# Patient Record
Sex: Female | Born: 1990 | Race: Black or African American | Hispanic: No | Marital: Single | Smoking: Never smoker
Health system: Southern US, Community
[De-identification: ages and names within clinical notes are randomized; demographics above are authoritative.]

## PROBLEM LIST (undated history)

## (undated) DIAGNOSIS — G43909 Migraine, unspecified, not intractable, without status migrainosus: Secondary | ICD-10-CM

## (undated) DIAGNOSIS — R87629 Unspecified abnormal cytological findings in specimens from vagina: Secondary | ICD-10-CM

## (undated) DIAGNOSIS — Z8619 Personal history of other infectious and parasitic diseases: Secondary | ICD-10-CM

## (undated) HISTORY — DX: Migraine, unspecified, not intractable, without status migrainosus: G43.909

## (undated) HISTORY — DX: Unspecified abnormal cytological findings in specimens from vagina: R87.629

## (undated) HISTORY — PX: THERAPEUTIC ABORTION: SHX798

## (undated) HISTORY — DX: Personal history of other infectious and parasitic diseases: Z86.19

## (undated) HISTORY — PX: NO PAST SURGERIES: SHX2092

---
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
## 2011-01-02 LAB — CBC
HCT: 28.8 % — ABNORMAL LOW (ref 36.0–49.0)
HCT: 39.1 % (ref 36.0–49.0)
Hemoglobin: 13.1 g/dL (ref 12.0–16.0)
MCHC: 33.6 g/dL (ref 31.0–37.0)
MCHC: 33.8 g/dL (ref 31.0–37.0)
MCV: 93.6 fL (ref 78.0–98.0)
MCV: 95.4 fL (ref 78.0–98.0)
Platelets: 167 10*3/uL (ref 150–400)
RBC: 4.17 MIL/uL (ref 3.80–5.70)
RDW: 13.1 % (ref 11.4–15.5)
RDW: 13.3 % (ref 11.4–15.5)
WBC: 17.5 10*3/uL — ABNORMAL HIGH (ref 4.5–13.5)

## 2011-01-05 ENCOUNTER — Emergency Department (HOSPITAL_COMMUNITY)
Admission: EM | Admit: 2011-01-05 | Discharge: 2011-01-05 | Disposition: A | Discharge status: Home / Self Care | Payer: Medicaid Other | Attending: Emergency Medicine | Admitting: Emergency Medicine

## 2011-01-05 DIAGNOSIS — R51 Headache: Secondary | ICD-10-CM | POA: Insufficient documentation

## 2011-01-05 DIAGNOSIS — R110 Nausea: Secondary | ICD-10-CM | POA: Insufficient documentation

## 2011-01-05 DIAGNOSIS — R040 Epistaxis: Secondary | ICD-10-CM | POA: Insufficient documentation

## 2011-01-08 LAB — URINALYSIS, ROUTINE W REFLEX MICROSCOPIC
Bilirubin Urine: NEGATIVE
Ketones, ur: NEGATIVE mg/dL
Nitrite: NEGATIVE
Protein, ur: NEGATIVE mg/dL
pH: 7 (ref 5.0–8.0)

## 2011-01-15 ENCOUNTER — Emergency Department (HOSPITAL_COMMUNITY)
Admission: EM | Admit: 2011-01-15 | Discharge: 2011-01-15 | Disposition: A | Discharge status: Home / Self Care | Payer: Medicaid Other | Attending: Emergency Medicine | Admitting: Emergency Medicine

## 2011-01-15 DIAGNOSIS — J069 Acute upper respiratory infection, unspecified: Secondary | ICD-10-CM | POA: Insufficient documentation

## 2011-01-15 DIAGNOSIS — J029 Acute pharyngitis, unspecified: Secondary | ICD-10-CM | POA: Insufficient documentation

## 2011-02-05 NOTE — Op Note (Signed)
NAME:  Chelsea Strong, Chelsea Strong             ACCOUNT NO.:  [id number]   MEDICAL RECORD NO.:  [id number]          PATIENT TYPE:  INP   LOCATION:  9116                          FACILITY:  [facility]   PHYSICIAN:  Tilda Burrow, M.D. DATE OF BIRTH:  07/12/1991   DATE OF PROCEDURE:  01/14/2009  DATE OF DISCHARGE:                               OPERATIVE REPORT   DELIVERY NOTE   Delivery time was 6:36 a.m.   Ms. Roll presented to [facility] at approximately 9:27 p.m. for  early active labor with cervix 4 cm, 95%, -2, after a second visit that  day.  She was admitted, blood pressures were slightly elevated  initially.  She was stretchable to 6 cm at 11 p.m. with epidural in  place, functioning well.  Low-dose oxytocin was initiated.  She was  completely dilated at approximately 4 a.m. and pushed for 2-1/2 hours.  The fetal heart rate was excellent until the last 30 minutes when  significant bradycardia with the pushing was present.  Significant  vulvar edema was present.  Baby was in the outlet 2 station plus at  least +3, so vacuum assistance was discussed, offered, accepted, and  utilized after in-and-out catheterization of the bladder.  Single  contraction was required to deliver the baby of a vacuum-assisted  vaginal delivery over an intact perineum that developed a second degree  of laceration at the posterior perineal body just to the left of the  midline that was quite deep.  Infant at Apgars 9 and 9 was placed on  maternal abdomen.  Cord was clamped.  Placenta delivered in [place]  presentation.  Routine cord blood samples obtained.  There was a  tendency toward uterine atony, which responded to vigorous uterine  massage and IV oxytocin.  EBL 500-600 mL.  The patient then proceeded to  undergo repair under epidural plus local anesthesia with good tissue  approximation.      Tilda Burrow, M.D.  Electronically Signed     JVF/MEDQ  D:  01/14/2009  T:  01/14/2009  Job:   [id number]

## 2011-06-25 LAB — URINALYSIS, ROUTINE W REFLEX MICROSCOPIC
Bilirubin Urine: NEGATIVE
Glucose, UA: NEGATIVE
Hgb urine dipstick: NEGATIVE
Ketones, ur: NEGATIVE
Nitrite: NEGATIVE
Protein, ur: NEGATIVE
Specific Gravity, Urine: 1.012
Urobilinogen, UA: 0.2
pH: 6.5

## 2011-06-25 LAB — URINE MICROSCOPIC-ADD ON

## 2011-07-08 LAB — CBC
HCT: 36.9
Hemoglobin: 12.2
MCHC: 33
MCV: 85.8
Platelets: 322
RBC: 4.3
RDW: 11.7
WBC: 6.7

## 2011-07-08 LAB — BASIC METABOLIC PANEL
BUN: 5 — ABNORMAL LOW
Chloride: 101
Glucose, Bld: 85
Potassium: 3.4 — ABNORMAL LOW

## 2011-07-08 LAB — URINALYSIS, ROUTINE W REFLEX MICROSCOPIC
Bilirubin Urine: NEGATIVE
Glucose, UA: NEGATIVE
Ketones, ur: NEGATIVE
Nitrite: NEGATIVE
Protein, ur: NEGATIVE
Specific Gravity, Urine: 1.025
Urobilinogen, UA: 0.2
pH: 6

## 2011-07-08 LAB — URINE MICROSCOPIC-ADD ON

## 2011-07-08 LAB — DIFFERENTIAL
Basophils Absolute: 0.1
Basophils Relative: 1
Eosinophils Absolute: 0
Eosinophils Relative: 1
Lymphs Abs: 1.9

## 2011-07-08 LAB — PREGNANCY, URINE: Preg Test, Ur: NEGATIVE

## 2012-09-23 NOTE — L&D Delivery Note (Signed)
Delivery Note At 4:58 PM a viable female was delivered via Vaginal, Spontaneous Delivery (Presentation: ROA ).  APGAR: 8, 9; weight TBA Placenta status: Intact, Spontaneous.  Cord: 3 vessels with the following complications: Short.   Anesthesia: Epidural  Episiotomy: None Lacerations: None Suture Repair: N/A Est. Blood Loss (mL): 200  Mom to postpartum.  Baby to nursery-stable.Skin to skin  [name] is a 22 y.o. female G2P1001 with IUP at [redacted]w[redacted]d by LMP, confirmed by [place] presented for SROM at 11pm last nightaginal bleeding.     Chelsea Strong 07/01/2013, 5:46 PM

## 2012-11-14 ENCOUNTER — Encounter (HOSPITAL_COMMUNITY): Payer: Self-pay | Admitting: *Deleted

## 2012-11-14 ENCOUNTER — Emergency Department (HOSPITAL_COMMUNITY)
Admission: EM | Admit: 2012-11-14 | Discharge: 2012-11-14 | Disposition: A | Discharge status: Home / Self Care | Payer: Medicaid Other | Attending: Emergency Medicine | Admitting: Emergency Medicine

## 2012-11-14 DIAGNOSIS — O218 Other vomiting complicating pregnancy: Secondary | ICD-10-CM | POA: Insufficient documentation

## 2012-11-14 DIAGNOSIS — R05 Cough: Secondary | ICD-10-CM | POA: Insufficient documentation

## 2012-11-14 DIAGNOSIS — Z3490 Encounter for supervision of normal pregnancy, unspecified, unspecified trimester: Secondary | ICD-10-CM

## 2012-11-14 DIAGNOSIS — R630 Anorexia: Secondary | ICD-10-CM | POA: Insufficient documentation

## 2012-11-14 DIAGNOSIS — R059 Cough, unspecified: Secondary | ICD-10-CM | POA: Insufficient documentation

## 2012-11-14 DIAGNOSIS — R1110 Vomiting, unspecified: Secondary | ICD-10-CM

## 2012-11-14 DIAGNOSIS — J029 Acute pharyngitis, unspecified: Secondary | ICD-10-CM | POA: Insufficient documentation

## 2012-11-14 DIAGNOSIS — R112 Nausea with vomiting, unspecified: Secondary | ICD-10-CM | POA: Insufficient documentation

## 2012-11-14 LAB — CBC WITH DIFFERENTIAL/PLATELET
Basophils Absolute: 0.1 10*3/uL (ref 0.0–0.1)
Eosinophils Relative: 0 % (ref 0–5)
Lymphocytes Relative: 52 % — ABNORMAL HIGH (ref 12–46)
MCV: 84.3 fL (ref 78.0–100.0)
Platelets: 250 10*3/uL (ref 150–400)
RDW: 11.7 % (ref 11.5–15.5)
WBC: 5 10*3/uL (ref 4.0–10.5)

## 2012-11-14 LAB — COMPREHENSIVE METABOLIC PANEL
ALT: 16 U/L (ref 0–35)
AST: 20 U/L (ref 0–37)
CO2: 24 mEq/L (ref 19–32)
Calcium: 8.6 mg/dL (ref 8.4–10.5)
GFR calc non Af Amer: >90 mL/min (ref >90)
Sodium: 133 mEq/L — ABNORMAL LOW (ref 135–145)
Total Protein: 7.4 g/dL (ref 6.0–8.3)

## 2012-11-14 MED ORDER — ONDANSETRON HCL 4 MG/2ML IJ SOLN
4.0000 mg | Freq: Once | INTRAMUSCULAR | Status: AC
  Administered 2012-11-14: 4 mg via INTRAVENOUS
  Filled 2012-11-14: qty 2

## 2012-11-14 MED ORDER — PROMETHAZINE HCL 25 MG PO TABS: 25.0000 mg | ORAL_TABLET | Freq: Four times a day (QID) | ORAL | Status: DC | PRN

## 2012-11-14 MED ORDER — SODIUM CHLORIDE 0.9 % IV BOLUS (SEPSIS)
1000.0000 mL | Freq: Once | INTRAVENOUS | Status: AC
  Administered 2012-11-14: 1000 mL via INTRAVENOUS

## 2012-11-14 NOTE — ED Notes (Addendum)
Pt c/o chills and vomiting x 4-5 times since this morning. Denies any other symptoms

## 2012-11-14 NOTE — ED Notes (Signed)
Pt discharged. Pt stable at time of discharge. Medications reviewed pt has no questions regarding discharge at this time. Pt voiced understanding of discharge instructions.  

## 2012-11-14 NOTE — ED Provider Notes (Signed)
History    This chart was scribed for Chelsea Lennert, MD by Melba Coon, ED Scribe. The patient was seen in room APA07/APA07 and the patient's care was started at 8:25PM.    CSN: [id number]  Arrival date & time 11/14/12  2012   First MD Initiated Contact with Patient 11/14/12 2022      Chief Complaint  Patient presents with  . Emesis  . Chills    (Consider location/radiation/quality/duration/timing/severity/associated sxs/prior treatment) Patient is a 22 y.o. female presenting with vomiting. The history is provided by the patient. No language interpreter was used.  Emesis Severity:  Moderate Duration:  2 days Timing:  Constant Quality:  Stomach contents Progression:  Worsening Chronicity:  New Relieved by:  Nothing Worsened by:  Nothing tried Ineffective treatments:  None tried Associated symptoms: chills, cough and sore throat   Associated symptoms: no diarrhea, no fever and no headaches    Chelsea Strong is a 22 y.o. female who presents to the Emergency Department complaining of persistent, moderate emesis with intermittent chills alternating with hot flashes with a gradual onset the past couple of days. She reports some nausea with decreased appetite. She reports she has had cough with sore throat that has alleviated since onset. No known allergies. No other pertinent medical symptoms.  History reviewed. No pertinent past medical history.  History reviewed. No pertinent past surgical history.  History reviewed. No pertinent family history.  History  Substance Use Topics  . Smoking status: Never Smoker   . Smokeless tobacco: Not on file  . Alcohol Use: No    OB History   Grav Para Term Preterm Abortions TAB SAB Ect Mult Living                  Review of Systems  Constitutional: Positive for chills.  HENT: Positive for sore throat.   Gastrointestinal: Positive for vomiting. Negative for diarrhea.  Neurological: Negative for headaches.  All other  systems reviewed and are negative.    Allergies  Review of patient's allergies indicates no known allergies.  Home Medications  No current outpatient prescriptions on file.  BP 117/73  Pulse 82  Temp(Src) 97.9 F (36.6 C) (Oral)  Resp 20  Ht 5\' 1"  (1.549 m)  Wt 121 lb (54.885 kg)  BMI 22.87 kg/m2  SpO2 98%  LMP 10/01/2012  Physical Exam  Nursing note and vitals reviewed. Constitutional: She is oriented to person, place, and time. She appears well-developed.  HENT:  Head: Normocephalic and atraumatic.  Eyes: Conjunctivae and EOM are normal. No scleral icterus.  Neck: Neck supple. No thyromegaly present.  Cardiovascular: Normal rate and regular rhythm.  Exam reveals no gallop and no friction rub.   No murmur heard. Pulmonary/Chest: No stridor. She has no wheezes. She has no rales. She exhibits no tenderness.  Abdominal: She exhibits no distension. There is no tenderness. There is no rebound.  Musculoskeletal: Normal range of motion. She exhibits no edema.  Lymphadenopathy:    She has no cervical adenopathy.  Neurological: She is oriented to person, place, and time. Coordination normal.  Skin: No rash noted. No erythema.  Psychiatric: She has a normal mood and affect. Her behavior is normal.    ED Course  Procedures (including critical care time)  DIAGNOSTIC STUDIES: Oxygen Saturation is 98% on room air, normal by my interpretation.    COORDINATION OF CARE:  8:28PM - IV fluids, Zofran, CBC with differential, and CMP will be ordered for Jodi Geralds.  9:30PM - lab results reviewed Labs Reviewed  CBC WITH DIFFERENTIAL - Abnormal; Notable for the following:    Hemoglobin 11.6 (*)    HCT 34.4 (*)    Neutrophils Relative 35 (*)    Lymphocytes Relative 52 (*)    Basophils Relative 2 (*)    All other components within normal limits  COMPREHENSIVE METABOLIC PANEL - Abnormal; Notable for the following:    Sodium 133 (*)    Potassium 3.4 (*)    All other  components within normal limits   No results found.   No diagnosis found.    MDM  The chart was scribed for me under my direct supervision.  I personally performed the history, physical, and medical decision making and all procedures in the evaluation of this patient.Chelsea Lennert, MD 11/14/12 [phone]

## 2012-11-26 LAB — OB RESULTS CONSOLE ABO/RH: RH Type: POSITIVE

## 2012-11-26 LAB — OB RESULTS CONSOLE VARICELLA ZOSTER ANTIBODY, IGG: Varicella: IMMUNE

## 2012-11-26 LAB — OB RESULTS CONSOLE HIV ANTIBODY (ROUTINE TESTING): HIV: NONREACTIVE

## 2012-11-26 LAB — OB RESULTS CONSOLE RUBELLA ANTIBODY, IGM: Rubella: IMMUNE

## 2012-11-26 LAB — OB RESULTS CONSOLE HEPATITIS B SURFACE ANTIGEN: Hepatitis B Surface Ag: NEGATIVE

## 2012-12-01 ENCOUNTER — Encounter: Payer: Self-pay | Admitting: *Deleted

## 2012-12-01 DIAGNOSIS — Z8619 Personal history of other infectious and parasitic diseases: Secondary | ICD-10-CM | POA: Insufficient documentation

## 2012-12-02 ENCOUNTER — Other Ambulatory Visit: Payer: Self-pay | Admitting: Obstetrics & Gynecology

## 2012-12-02 DIAGNOSIS — Z36 Encounter for antenatal screening of mother: Secondary | ICD-10-CM

## 2012-12-22 ENCOUNTER — Emergency Department (HOSPITAL_COMMUNITY)
Admission: EM | Admit: 2012-12-22 | Discharge: 2012-12-22 | Disposition: A | Discharge status: Home / Self Care | Payer: Medicaid Other | Attending: Emergency Medicine | Admitting: Emergency Medicine

## 2012-12-22 ENCOUNTER — Emergency Department (HOSPITAL_COMMUNITY): Payer: Medicaid Other

## 2012-12-22 ENCOUNTER — Encounter (HOSPITAL_COMMUNITY): Payer: Self-pay | Admitting: *Deleted

## 2012-12-22 DIAGNOSIS — R102 Pelvic and perineal pain: Secondary | ICD-10-CM

## 2012-12-22 DIAGNOSIS — N949 Unspecified condition associated with female genital organs and menstrual cycle: Secondary | ICD-10-CM | POA: Insufficient documentation

## 2012-12-22 DIAGNOSIS — O210 Mild hyperemesis gravidarum: Secondary | ICD-10-CM | POA: Insufficient documentation

## 2012-12-22 DIAGNOSIS — Z3490 Encounter for supervision of normal pregnancy, unspecified, unspecified trimester: Secondary | ICD-10-CM

## 2012-12-22 DIAGNOSIS — R109 Unspecified abdominal pain: Secondary | ICD-10-CM | POA: Insufficient documentation

## 2012-12-22 DIAGNOSIS — O9989 Other specified diseases and conditions complicating pregnancy, childbirth and the puerperium: Secondary | ICD-10-CM | POA: Insufficient documentation

## 2012-12-22 DIAGNOSIS — Z8619 Personal history of other infectious and parasitic diseases: Secondary | ICD-10-CM | POA: Insufficient documentation

## 2012-12-22 LAB — CBC
Platelets: 264 10*3/uL (ref 150–400)
RDW: 12.5 % (ref 11.5–15.5)
WBC: 6.1 10*3/uL (ref 4.0–10.5)

## 2012-12-22 LAB — HCG, QUANTITATIVE, PREGNANCY: hCG, Beta Chain, Quant, S: 72863 m[IU]/mL — ABNORMAL HIGH (ref <5)

## 2012-12-22 LAB — WET PREP, GENITAL
Clue Cells Wet Prep HPF POC: NONE SEEN
WBC, Wet Prep HPF POC: NONE SEEN

## 2012-12-22 LAB — URINALYSIS, ROUTINE W REFLEX MICROSCOPIC
Glucose, UA: NEGATIVE mg/dL
Hgb urine dipstick: NEGATIVE
Ketones, ur: NEGATIVE mg/dL
Leukocytes, UA: NEGATIVE
Protein, ur: NEGATIVE mg/dL

## 2012-12-22 LAB — ABO/RH: ABO/RH(D): A POS

## 2012-12-22 LAB — OB RESULTS CONSOLE GC/CHLAMYDIA: Chlamydia: NEGATIVE

## 2012-12-22 LAB — PREGNANCY, URINE: Preg Test, Ur: POSITIVE — AB

## 2012-12-22 MED ORDER — ONDANSETRON HCL 4 MG PO TABS: 4.0000 mg | ORAL_TABLET | Freq: Three times a day (TID) | ORAL | Status: DC | PRN

## 2012-12-22 NOTE — ED Notes (Signed)
Patient requesting pain medication. MD aware. Room set up for pelvic exam. MD aware and to find RN when ready.

## 2012-12-22 NOTE — ED Notes (Signed)
Pt c/o n/v left lower quad abd pain that became worse since yesterday, is currently [redacted] weeks pregnant with EDD 07/09/2013

## 2012-12-22 NOTE — ED Notes (Signed)
Patient with no complaints at this time. Respirations even and unlabored. Skin warm/dry. Discharge instructions reviewed with patient at this time. Patient given opportunity to voice concerns/ask questions. Patient discharged at this time and left Emergency Department with steady gait.   

## 2012-12-22 NOTE — ED Provider Notes (Signed)
History     CSN: [id number]  Arrival date & time 12/22/12  1449   First MD Initiated Contact with Patient 12/22/12 1507      Chief Complaint  Patient presents with  . Abdominal Pain  . Nausea  . Emesis During Pregnancy     HPI Pt was seen at 1525.   Per pt, c/o gradual onset and persistence of constant left lower pelvic "pain" that began yesterday. Pt states she has had several episodes of N/V today.  Hx G2P1, EDC 07/09/2013 with EGA [redacted] weeks 4/7 days.  Denies vaginal bleeding, no back/flank pain, no dysuria/hematuria, no CP/SOB, no diarrhea.     Past Medical History  Diagnosis Date  . History of trichomoniasis   . History of chlamydia     Past Surgical History  Procedure Laterality Date  . No past surgeries      Family History  Problem Relation Age of Onset  . Asthma Father   . Cancer Paternal Aunt     breast    History  Substance Use Topics  . Smoking status: Never Smoker   . Smokeless tobacco: Not on file  . Alcohol Use: No    OB History   Grav Para Term Preterm Abortions TAB SAB Ect Mult Living   2 1 1       1       Review of Systems ROS: Statement: All systems negative except as marked or noted in the HPI; Constitutional: Negative for fever and chills. ; ; Eyes: Negative for eye pain, redness and discharge. ; ; ENMT: Negative for ear pain, hoarseness, nasal congestion, sinus pressure and sore throat. ; ; Cardiovascular: Negative for chest pain, palpitations, diaphoresis, dyspnea and peripheral edema. ; ; Respiratory: Negative for cough, wheezing and stridor. ; ; Gastrointestinal: +N/V. Negative for diarrhea, abdominal pain, blood in stool, hematemesis, jaundice and rectal bleeding. . ; ; Genitourinary: Negative for dysuria, flank pain and hematuria. ; ; GYN:  +pelvic pain. No vaginal bleeding, no vaginal discharge, no vulvar pain.;; Musculoskeletal: Negative for back pain and neck pain. Negative for swelling and trauma.; ; Skin: Negative for pruritus, rash,  abrasions, blisters, bruising and skin lesion.; ; Neuro: Negative for headache, lightheadedness and neck stiffness. Negative for weakness, altered level of consciousness , altered mental status, extremity weakness, paresthesias, involuntary movement, seizure and syncope.       Allergies  Review of patient's allergies indicates no known allergies.  Home Medications   Current Outpatient Rx  Name  Route  Sig  Dispense  Refill  . prenatal vitamin w/FE, FA (PRENATAL 1 + 1) 27-1 MG TABS   Oral   Take 1 tablet by mouth daily.         . promethazine (PHENERGAN) 25 MG tablet   Oral   Take 1 tablet (25 mg total) by mouth every 6 (six) hours as needed for nausea.   15 tablet   0     BP 90/68  Pulse 75  Temp(Src) 97.9 F (36.6 C) (Oral)  Resp 16  Ht 5\' 1"  (1.549 m)  Wt 121 lb 3 oz (54.97 kg)  BMI 22.91 kg/m2  SpO2 100%  LMP 10/02/2012  Physical Exam 1530: Physical examination:  Nursing notes reviewed; Vital signs and O2 SAT reviewed;  Constitutional: Well developed, Well nourished, Well hydrated, In no acute distress; Head:  Normocephalic, atraumatic; Eyes: EOMI, PERRL, No scleral icterus; ENMT: Mouth and pharynx normal, Mucous membranes moist; Neck: Supple, Full range of motion, No lymphadenopathy; Cardiovascular:  Regular rate and rhythm, No murmur, rub, or gallop; Respiratory: Breath sounds clear & equal bilaterally, No rales, rhonchi, wheezes.  Speaking full sentences with ease, Normal respiratory effort/excursion; Chest: Nontender, Movement normal; Abdomen: Soft, +suprapubic and left pelvic tenderness to palp. No rebound or guarding. Nondistended, Normal bowel sounds; Genitourinary: No CVA tenderness. Pelvic exam performed with permission of pt and female ED tech assist during exam.  External genitalia w/o lesions. Vaginal vault with thick white discharge, no bleeding.  Cervix w/o lesions, not friable, GC/chlam and wet prep obtained and sent to lab.  Bimanual exam w/o CMT, uterine or  right adnexal tenderness, +left pelvic tenderness.;; Extremities: Pulses normal, No tenderness, No edema, No calf edema or asymmetry.; Neuro: AA&Ox3, Major CN grossly intact.  Speech clear. No gross focal motor or sensory deficits in extremities.; Skin: Color normal, Warm, Dry.   ED Course  Procedures     MDM  MDM Reviewed: previous chart, nursing note and vitals Reviewed previous: labs Interpretation: ultrasound and labs   Results for orders placed during the hospital encounter of 12/22/12  WET PREP, GENITAL      Result Value Range   Yeast Wet Prep HPF POC NONE SEEN  NONE SEEN   Trich, Wet Prep NONE SEEN  NONE SEEN   Clue Cells Wet Prep HPF POC NONE SEEN  NONE SEEN   WBC, Wet Prep HPF POC NONE SEEN  NONE SEEN  URINALYSIS, ROUTINE W REFLEX MICROSCOPIC      Result Value Range   Color, Urine YELLOW  YELLOW   APPearance CLEAR  CLEAR   Specific Gravity, Urine >1.030 (*) 1.005 - 1.030   pH 5.5  5.0 - 8.0   Glucose, UA NEGATIVE  NEGATIVE mg/dL   Hgb urine dipstick NEGATIVE  NEGATIVE   Bilirubin Urine NEGATIVE  NEGATIVE   Ketones, ur NEGATIVE  NEGATIVE mg/dL   Protein, ur NEGATIVE  NEGATIVE mg/dL   Urobilinogen, UA 0.2  0.0 - 1.0 mg/dL   Nitrite NEGATIVE  NEGATIVE   Leukocytes, UA NEGATIVE  NEGATIVE  PREGNANCY, URINE      Result Value Range   Preg Test, Ur POSITIVE (*) NEGATIVE  CBC      Result Value Range   WBC 6.1  4.0 - 10.5 K/uL   RBC 4.13  3.87 - 5.11 MIL/uL   Hemoglobin 11.7 (*) 12.0 - 15.0 g/dL   HCT 16.1 (*) 09.6 - 04.5 %   MCV 84.0  78.0 - 100.0 fL   MCH 28.3  26.0 - 34.0 pg   MCHC 33.7  30.0 - 36.0 g/dL   RDW 40.9  81.1 - 91.4 %   Platelets 264  150 - 400 K/uL  HCG, QUANTITATIVE, PREGNANCY      Result Value Range   hCG, Beta Chain, Quant, [place] [address] (*) <5 mIU/mL  ABO/RH      Result Value Range   ABO/RH(D) A POS     US Ob Comp Less 14 Wks 12/22/2012  *RADIOLOGY REPORT*  Clinical Data:  Left lower quadrant pain.  Positive pregnancy test. Clinical suspicion for  ovarian torsion.  11-week-4-day gestational age by LMP.  OBSTETRIC <14 WK [place] AND TRANSVAGINAL OB US DOPPLER ULTRASOUND OF OVARIES  Technique:  Both transabdominal and transvaginal ultrasound examinations were performed for complete evaluation of the gestation as well as the maternal uterus, adnexal regions, and pelvic cul-de-sac.  Transvaginal technique was performed to assess early pregnancy.  Color and duplex Doppler ultrasound was utilized to evaluate blood flow to the  ovaries.  Comparison:  None.  Findings:  Intrauterine gestational sac: Visualized/normal in shape. Yolk sac:  Visualized Embryo:  Visualized Cardiac Activity:  Visualized Heart Rate:  173bpm  CRL:  61    mm  12w  4 d            [place] EDC:  07/02/2013  Maternal uterus/adnexae: No subchorionic hemorrhage seen.  Right ovary not directly visualized although no right adnexal mass identified.  The left ovary is normal appearance and contains a small corpus luteum cyst measuring approximately 1.3 cm.  Internal blood flow is seen within the left ovary.  Pulsed Doppler evaluation demonstrates normal appearing low resistance arterial and venous waveforms within the left ovary. Right ovary not visualized.  IMPRESSION:  1.  Single living IUP measuring 12 weeks 4 days with [place] EDC of 07/02/2013. 2.  No significant maternal uterine or adnexal abnormality identified. 3.  No sonographic evidence for left ovarian torsion. Nonvisualization of right ovary.   Original Report Authenticated By: Myles Rosenthal, M.D.    Results for CORRI, DELAPAZ (MRN [id number]) as of 12/22/2012 18:27  Ref. Range 04/30/2007 14:00 01/13/2009 21:30 01/15/2009 05:47 11/14/2012 20:43 12/22/2012 15:35  Hemoglobin Latest Range: 12.0-15.0 g/dL 24.4 01.0 9.7 DELTA CHECK NOTED (L) 11.6 (L) 11.7 (L)  HCT Latest Range: 36.0-46.0 % 36.9 39.1 28.8 (L) 34.4 (L) 34.7 (L)     1820:  Right pelvis and upper abd remain NT on serial exams. No left ovarian torsion. +IUP.  Has tol PO well while in the ED without  N/V. Walking around exam room after getting herself dressed and states she wants to go home now.  Dx and testing d/w pt and family.  Questions answered.  Verb understanding, agreeable to d/c home with outpt f/u.             Laray Anger, DO 12/24/12 2116

## 2012-12-23 LAB — URINE CULTURE
Colony Count: NO GROWTH
Culture: NO GROWTH

## 2012-12-31 ENCOUNTER — Ambulatory Visit (INDEPENDENT_AMBULATORY_CARE_PROVIDER_SITE_OTHER): Payer: Medicaid Other

## 2012-12-31 ENCOUNTER — Encounter: Payer: Self-pay | Admitting: Obstetrics and Gynecology

## 2012-12-31 ENCOUNTER — Other Ambulatory Visit: Payer: Self-pay | Admitting: Obstetrics and Gynecology

## 2012-12-31 ENCOUNTER — Ambulatory Visit (INDEPENDENT_AMBULATORY_CARE_PROVIDER_SITE_OTHER): Payer: Medicaid Other | Admitting: Obstetrics and Gynecology

## 2012-12-31 VITALS — BP 110/60 | Wt 121.0 lb

## 2012-12-31 DIAGNOSIS — Z3490 Encounter for supervision of normal pregnancy, unspecified, unspecified trimester: Secondary | ICD-10-CM

## 2012-12-31 DIAGNOSIS — Z331 Pregnant state, incidental: Secondary | ICD-10-CM

## 2012-12-31 DIAGNOSIS — Z8619 Personal history of other infectious and parasitic diseases: Secondary | ICD-10-CM

## 2012-12-31 DIAGNOSIS — Z3480 Encounter for supervision of other normal pregnancy, unspecified trimester: Secondary | ICD-10-CM

## 2012-12-31 DIAGNOSIS — Z1389 Encounter for screening for other disorder: Secondary | ICD-10-CM

## 2012-12-31 DIAGNOSIS — Z36 Encounter for antenatal screening of mother: Secondary | ICD-10-CM

## 2012-12-31 LAB — POCT URINALYSIS DIPSTICK
Leukocytes, UA: NEGATIVE
Nitrite, UA: NEGATIVE
Protein, UA: NEGATIVE

## 2012-12-31 NOTE — Progress Notes (Signed)
1st IT

## 2012-12-31 NOTE — Progress Notes (Signed)
U/S-(12+6wks)-single IUP with +FCA noted FHR=163 BPM, CRL c/w dates, NT=1.67mm, NB-present, cx long and closed, bilateral adnexa wnl LT OV cyst resolved

## 2012-12-31 NOTE — Progress Notes (Signed)
[redacted]w[redacted]d  A: Normal 1st tri visit:  P: first IT drawn today.

## 2013-01-28 ENCOUNTER — Other Ambulatory Visit: Payer: Self-pay | Admitting: Advanced Practice Midwife

## 2013-01-28 ENCOUNTER — Encounter: Payer: Self-pay | Admitting: Advanced Practice Midwife

## 2013-01-28 ENCOUNTER — Ambulatory Visit (INDEPENDENT_AMBULATORY_CARE_PROVIDER_SITE_OTHER): Payer: Medicaid Other | Admitting: Advanced Practice Midwife

## 2013-01-28 VITALS — BP 118/66 | Wt 123.0 lb

## 2013-01-28 DIAGNOSIS — Z3480 Encounter for supervision of other normal pregnancy, unspecified trimester: Secondary | ICD-10-CM

## 2013-01-28 DIAGNOSIS — Z331 Pregnant state, incidental: Secondary | ICD-10-CM

## 2013-01-28 DIAGNOSIS — O99019 Anemia complicating pregnancy, unspecified trimester: Secondary | ICD-10-CM

## 2013-01-28 DIAGNOSIS — Z1389 Encounter for screening for other disorder: Secondary | ICD-10-CM

## 2013-01-28 NOTE — Progress Notes (Signed)
2nd IT today.  Works at [employer], stands a lot.  May get maternity belt.    Routine questions about pregnancy answered.  F/U in 3 weeks for anatomy scan.

## 2013-01-28 NOTE — Progress Notes (Signed)
Here today for routine prenatal visit, having some pressure and lower back pain.

## 2013-01-29 LAB — POCT URINALYSIS DIPSTICK
Blood, UA: NEGATIVE
Ketones, UA: NEGATIVE
Protein, UA: NEGATIVE

## 2013-02-03 LAB — MATERNAL SCREEN, INTEGRATED #2
AFP MoM: 0.74
Calculated Gestational Age: 17.4
Crown Rump Length: 75.1 mm
Inhibin A Dimeric: 98 pg/mL
MSS Down Syndrome: <1:5000 {titer}
MSS Trisomy 18 Risk: <1:5000 {titer}
NT MoM: 0.89
Number of fetuses: 1
PAPP-A MoM: 0.77
PAPP-A: 1906 ng/mL
Rish for ONTD: <1:5000 {titer}
hCG, Serum: 33.7 IU/mL

## 2013-02-18 ENCOUNTER — Ambulatory Visit (INDEPENDENT_AMBULATORY_CARE_PROVIDER_SITE_OTHER): Payer: Medicaid Other | Admitting: Obstetrics & Gynecology

## 2013-02-18 ENCOUNTER — Encounter: Payer: Self-pay | Admitting: Obstetrics & Gynecology

## 2013-02-18 ENCOUNTER — Ambulatory Visit (INDEPENDENT_AMBULATORY_CARE_PROVIDER_SITE_OTHER): Payer: Medicaid Other

## 2013-02-18 VITALS — BP 100/50 | Wt 128.0 lb

## 2013-02-18 DIAGNOSIS — O2390 Unspecified genitourinary tract infection in pregnancy, unspecified trimester: Secondary | ICD-10-CM

## 2013-02-18 DIAGNOSIS — Z3482 Encounter for supervision of other normal pregnancy, second trimester: Secondary | ICD-10-CM

## 2013-02-18 DIAGNOSIS — Z1389 Encounter for screening for other disorder: Secondary | ICD-10-CM

## 2013-02-18 DIAGNOSIS — Z331 Pregnant state, incidental: Secondary | ICD-10-CM

## 2013-02-18 DIAGNOSIS — O99019 Anemia complicating pregnancy, unspecified trimester: Secondary | ICD-10-CM

## 2013-02-18 DIAGNOSIS — Z3480 Encounter for supervision of other normal pregnancy, unspecified trimester: Secondary | ICD-10-CM

## 2013-02-18 DIAGNOSIS — N390 Urinary tract infection, site not specified: Secondary | ICD-10-CM

## 2013-02-18 LAB — POCT URINALYSIS DIPSTICK
Glucose, UA: NEGATIVE
Ketones, UA: NEGATIVE
Leukocytes, UA: NEGATIVE

## 2013-02-18 MED ORDER — NITROFURANTOIN MONOHYD MACRO 100 MG PO CAPS: 100.0000 mg | ORAL_CAPSULE | Freq: Two times a day (BID) | ORAL | Status: DC

## 2013-02-18 NOTE — Progress Notes (Signed)
Sonogram noted and reviewed with patient BP weight and urine results all reviewed and noted. Patient reports good fetal movement, denies any bleeding and no rupture of membranes symptoms or regular contractions. Patient is without complaints. All questions were answered.

## 2013-02-18 NOTE — Progress Notes (Signed)
U/S (19+6wks)-active fetus, meas c/w dates, fluid wnl, no major abnl noted, cx long and closed, bilateral adnexa wnl, post gr 0 plac, female fetus

## 2013-02-18 NOTE — Addendum Note (Signed)
Addended by: Lazaro Arms on: 02/18/2013 02:27 PM   Modules accepted: Orders

## 2013-02-18 NOTE — Progress Notes (Signed)
URINE shown nitrate. Will send for C/S.

## 2013-02-19 LAB — US OB COMP + 14 WK

## 2013-02-20 LAB — URINE CULTURE: Colony Count: 25000

## 2013-03-18 ENCOUNTER — Encounter: Payer: Medicaid Other | Admitting: Advanced Practice Midwife

## 2013-03-25 ENCOUNTER — Telehealth: Payer: Self-pay | Admitting: Obstetrics and Gynecology

## 2013-03-29 ENCOUNTER — Ambulatory Visit: Payer: Medicaid Other | Admitting: Obstetrics & Gynecology

## 2013-04-02 ENCOUNTER — Encounter: Payer: Medicaid Other | Admitting: Obstetrics and Gynecology

## 2013-04-12 ENCOUNTER — Encounter: Payer: Self-pay | Admitting: Women's Health

## 2013-04-12 ENCOUNTER — Ambulatory Visit (INDEPENDENT_AMBULATORY_CARE_PROVIDER_SITE_OTHER): Payer: Medicaid Other | Admitting: Women's Health

## 2013-04-12 VITALS — BP 100/60 | Wt 137.5 lb

## 2013-04-12 DIAGNOSIS — N898 Other specified noninflammatory disorders of vagina: Secondary | ICD-10-CM

## 2013-04-12 DIAGNOSIS — O0932 Supervision of pregnancy with insufficient antenatal care, second trimester: Secondary | ICD-10-CM

## 2013-04-12 DIAGNOSIS — O2390 Unspecified genitourinary tract infection in pregnancy, unspecified trimester: Secondary | ICD-10-CM

## 2013-04-12 DIAGNOSIS — B9689 Other specified bacterial agents as the cause of diseases classified elsewhere: Secondary | ICD-10-CM

## 2013-04-12 DIAGNOSIS — O99019 Anemia complicating pregnancy, unspecified trimester: Secondary | ICD-10-CM

## 2013-04-12 DIAGNOSIS — Z3482 Encounter for supervision of other normal pregnancy, second trimester: Secondary | ICD-10-CM

## 2013-04-12 DIAGNOSIS — O0930 Supervision of pregnancy with insufficient antenatal care, unspecified trimester: Secondary | ICD-10-CM | POA: Insufficient documentation

## 2013-04-12 LAB — POCT WET PREP (WET MOUNT): Clue Cells Wet Prep Whiff POC: POSITIVE

## 2013-04-12 MED ORDER — METRONIDAZOLE 500 MG PO TABS: 500.0000 mg | ORAL_TABLET | Freq: Two times a day (BID) | ORAL | Status: DC

## 2013-04-12 NOTE — Addendum Note (Signed)
Addended by: Cheral Marker on: 04/12/2013 10:42 AM   Modules accepted: Orders

## 2013-04-12 NOTE — Patient Instructions (Signed)
You will have your sugar test next visit.  Please do not eat or drink anything after midnight the night before you come, not even water.  You will be here for at least two hours.    Pregnancy - Second Trimester The second trimester of pregnancy (3 to 6 months) is a period of rapid growth for you and your baby. At the end of the sixth month, your baby is about 9 inches long and weighs 1 1/2 pounds. You will begin to feel the baby move between 18 and 20 weeks of the pregnancy. This is called quickening. Weight gain is faster. A clear fluid (colostrum) may leak out of your breasts. You may feel small contractions of the womb (uterus). This is known as false labor or Braxton-Hicks contractions. This is like a practice for labor when the baby is ready to be born. Usually, the problems with morning sickness have usually passed by the end of your first trimester. Some women develop small dark blotches (called cholasma, mask of pregnancy) on their face that usually goes away after the baby is born. Exposure to the sun makes the blotches worse. Acne may also develop in some pregnant women and pregnant women who have acne, may find that it goes away. PRENATAL EXAMS  Blood work may continue to be done during prenatal exams. These tests are done to check on your health and the probable health of your baby. Blood work is used to follow your blood levels (hemoglobin). Anemia (low hemoglobin) is common during pregnancy. Iron and vitamins are given to help prevent this. You will also be checked for diabetes between 24 and 28 weeks of the pregnancy. Some of the previous blood tests may be repeated.  The size of the uterus is measured during each visit. This is to make sure that the baby is continuing to grow properly according to the dates of the pregnancy.  Your blood pressure is checked every prenatal visit. This is to make sure you are not getting toxemia.  Your urine is checked to make sure you do not have an  infection, diabetes or protein in the urine.  Your weight is checked often to make sure gains are happening at the suggested rate. This is to ensure that both you and your baby are growing normally.  Sometimes, an ultrasound is performed to confirm the proper growth and development of the baby. This is a test which bounces harmless sound waves off the baby so your caregiver can more accurately determine due dates. Sometimes, a test is done on the amniotic fluid surrounding the baby. This test is called an amniocentesis. The amniotic fluid is obtained by sticking a needle into the belly (abdomen). This is done to check the chromosomes in instances where there is a concern about possible genetic problems with the baby. It is also sometimes done near the end of pregnancy if an early delivery is required. In this case, it is done to help make sure the baby's lungs are mature enough for the baby to live outside of the womb. CHANGES OCCURING IN THE SECOND TRIMESTER OF PREGNANCY Your body goes through many changes during pregnancy. They vary from person to person. Talk to your caregiver about changes you notice that you are concerned about.  During the second trimester, you will likely have an increase in your appetite. It is normal to have cravings for certain foods. This varies from person to person and pregnancy to pregnancy.  Your lower abdomen will begin to   bulge.  You may have to urinate more often because the uterus and baby are pressing on your bladder. It is also common to get more bladder infections during pregnancy. You can help this by drinking lots of fluids and emptying your bladder before and after intercourse.  You may begin to get stretch marks on your hips, abdomen, and breasts. These are normal changes in the body during pregnancy. There are no exercises or medicines to take that prevent this change.  You may begin to develop swollen and bulging veins (varicose veins) in your legs.  Wearing support hose, elevating your feet for 15 minutes, 3 to 4 times a day and limiting salt in your diet helps lessen the problem.  Heartburn may develop as the uterus grows and pushes up against the stomach. Antacids recommended by your caregiver helps with this problem. Also, eating smaller meals 4 to 5 times a day helps.  Constipation can be treated with a stool softener or adding bulk to your diet. Drinking lots of fluids, and eating vegetables, fruits, and whole grains are helpful.  Exercising is also helpful. If you have been very active up until your pregnancy, most of these activities can be continued during your pregnancy. If you have been less active, it is helpful to start an exercise program such as walking.  Hemorrhoids may develop at the end of the second trimester. Warm sitz baths and hemorrhoid cream recommended by your caregiver helps hemorrhoid problems.  Backaches may develop during this time of your pregnancy. Avoid heavy lifting, wear low heal shoes, and practice good posture to help with backache problems.  Some pregnant women develop tingling and numbness of their hand and fingers because of swelling and tightening of ligaments in the wrist (carpel tunnel syndrome). This goes away after the baby is born.  As your breasts enlarge, you may have to get a bigger bra. Get a comfortable, cotton, support bra. Do not get a nursing bra until the last month of the pregnancy if you will be nursing the baby.  You may get a dark line from your belly button to the pubic area called the linea nigra.  You may develop rosy cheeks because of increase blood flow to the face.  You may develop spider looking lines of the face, neck, arms, and chest. These go away after the baby is born. HOME CARE INSTRUCTIONS   It is extremely important to avoid all smoking, herbs, alcohol, and unprescribed drugs during your pregnancy. These chemicals affect the formation and growth of the baby. Avoid  these chemicals throughout the pregnancy to ensure the delivery of a healthy infant.  Most of your home care instructions are the same as suggested for the first trimester of your pregnancy. Keep your caregiver's appointments. Follow your caregiver's instructions regarding medicine use, exercise, and diet.  During pregnancy, you are providing food for you and your baby. Continue to eat regular, well-balanced meals. Choose foods such as meat, fish, milk and other low fat dairy products, vegetables, fruits, and whole-grain breads and cereals. Your caregiver will tell you of the ideal weight gain.  A physical sexual relationship may be continued up until near the end of pregnancy if there are no other problems. Problems could include early (premature) leaking of amniotic fluid from the membranes, vaginal bleeding, abdominal pain, or other medical or pregnancy problems.  Exercise regularly if there are no restrictions. Check with your caregiver if you are unsure of the safety of some of your exercises. The   greatest weight gain will occur in the last 2 trimesters of pregnancy. Exercise will help you:  Control your weight.  Get you in shape for labor and delivery.  Lose weight after you have the baby.  Wear a good support or jogging bra for breast tenderness during pregnancy. This may help if worn during sleep. Pads or tissues may be used in the bra if you are leaking colostrum.  Do not use hot tubs, steam rooms or saunas throughout the pregnancy.  Wear your seat belt at all times when driving. This protects you and your baby if you are in an accident.  Avoid raw meat, uncooked cheese, cat litter boxes, and soil used by cats. These carry germs that can cause birth defects in the baby.  The second trimester is also a good time to visit your dentist for your dental health if this has not been done yet. Getting your teeth cleaned is okay. Use a soft toothbrush. Brush gently during pregnancy.  It is  easier to leak urine during pregnancy. Tightening up and strengthening the pelvic muscles will help with this problem. Practice stopping your urination while you are going to the bathroom. These are the same muscles you need to strengthen. It is also the muscles you would use as if you were trying to stop from passing gas. You can practice tightening these muscles up 10 times a set and repeating this about 3 times per day. Once you know what muscles to tighten up, do not perform these exercises during urination. It is more likely to contribute to an infection by backing up the urine.  Ask for help if you have financial, counseling, or nutritional needs during pregnancy. Your caregiver will be able to offer counseling for these needs as well as refer you for other special needs.  Your skin may become oily. If so, wash your face with mild soap, use non-greasy moisturizer and oil or cream based makeup. MEDICINES AND DRUG USE IN PREGNANCY  Take prenatal vitamins as directed. The vitamin should contain 1 milligram of folic acid. Keep all vitamins out of reach of children. Only a couple vitamins or tablets containing iron may be fatal to a baby or young child when ingested.  Avoid use of all medicines, including herbs, over-the-counter medicines, not prescribed or suggested by your caregiver. Only take over-the-counter or prescription medicines for pain, discomfort, or fever as directed by your caregiver. Do not use aspirin.  Let your caregiver also know about herbs you may be using.  Alcohol is related to a number of birth defects. This includes fetal alcohol syndrome. All alcohol, in any form, should be avoided completely. Smoking will cause low birth rate and premature babies.  Street or illegal drugs are very harmful to the baby. They are absolutely forbidden. A baby born to an addicted mother will be addicted at birth. The baby will go through the same withdrawal an adult does. SEEK MEDICAL CARE IF:    You have any concerns or worries during your pregnancy. It is better to call with your questions if you feel they cannot wait, rather than worry about them. SEEK IMMEDIATE MEDICAL CARE IF:   An unexplained oral temperature above 102 F (38.9 C) develops, or as your caregiver suggests.  You have leaking of fluid from the vagina (birth canal). If leaking membranes are suspected, take your temperature and tell your caregiver of this when you call.  There is vaginal spotting, bleeding, or passing clots. Tell your caregiver of   the amount and how many pads are used. Light spotting in pregnancy is common, especially following intercourse.  You develop a bad smelling vaginal discharge with a change in the color from clear to white.  You continue to feel sick to your stomach (nauseated) and have no relief from remedies suggested. You vomit blood or coffee ground-like materials.  You lose more than 2 pounds of weight or gain more than 2 pounds of weight over 1 week, or as suggested by your caregiver.  You notice swelling of your face, hands, feet, or legs.  You get exposed to German measles and have never had them.  You are exposed to fifth disease or chickenpox.  You develop belly (abdominal) pain. Round ligament discomfort is a common non-cancerous (benign) cause of abdominal pain in pregnancy. Your caregiver still must evaluate you.  You develop a bad headache that does not go away.  You develop fever, diarrhea, pain with urination, or shortness of breath.  You develop visual problems, blurry, or double vision.  You fall or are in a car accident or any kind of trauma.  There is mental or physical violence at home. Document Released: 09/03/2001 Document Revised: 06/03/2012 Document Reviewed: 03/08/2009 [employer] Patient Information 2014 [employer].  

## 2013-04-12 NOTE — Progress Notes (Signed)
Reports good fm. Denies uc's, lof, vb, urinary frequency, urgency, hesitancy, or dysuria. Has missed 2 appts. Due for PN2. Milky white malodorous d/c w/ itch x 1wk. Spec exam mod amount thin whitish-yellow malodorous d/c, wet prep +clues. Rx flagyl.  Reviewed ptl s/s, fetal kick counts.  All questions answered. F/U in 1wk for PN2.

## 2013-04-12 NOTE — Progress Notes (Signed)
Thin, milky discharge that started last week.

## 2013-04-20 ENCOUNTER — Other Ambulatory Visit: Payer: Medicaid Other

## 2013-04-20 ENCOUNTER — Encounter: Payer: Self-pay | Admitting: Women's Health

## 2013-04-20 ENCOUNTER — Ambulatory Visit (INDEPENDENT_AMBULATORY_CARE_PROVIDER_SITE_OTHER): Payer: Medicaid Other | Admitting: Women's Health

## 2013-04-20 VITALS — BP 100/60 | Wt 135.0 lb

## 2013-04-20 DIAGNOSIS — O0930 Supervision of pregnancy with insufficient antenatal care, unspecified trimester: Secondary | ICD-10-CM

## 2013-04-20 DIAGNOSIS — Z3483 Encounter for supervision of other normal pregnancy, third trimester: Secondary | ICD-10-CM

## 2013-04-20 DIAGNOSIS — O99019 Anemia complicating pregnancy, unspecified trimester: Secondary | ICD-10-CM

## 2013-04-20 DIAGNOSIS — Z1389 Encounter for screening for other disorder: Secondary | ICD-10-CM

## 2013-04-20 DIAGNOSIS — Z331 Pregnant state, incidental: Secondary | ICD-10-CM

## 2013-04-20 DIAGNOSIS — Z3480 Encounter for supervision of other normal pregnancy, unspecified trimester: Secondary | ICD-10-CM

## 2013-04-20 LAB — POCT URINALYSIS DIPSTICK
Blood, UA: NEGATIVE
Glucose, UA: NEGATIVE
Ketones, UA: NEGATIVE
Nitrite, UA: NEGATIVE

## 2013-04-20 LAB — CBC
MCV: 85 fL (ref 78.0–100.0)
Platelets: 217 10*3/uL (ref 150–400)
RBC: 3.81 MIL/uL — ABNORMAL LOW (ref 3.87–5.11)
RDW: 12.5 % (ref 11.5–15.5)
WBC: 8.2 10*3/uL (ref 4.0–10.5)

## 2013-04-20 NOTE — Progress Notes (Signed)
FOR PN2 TODAY.

## 2013-04-20 NOTE — Progress Notes (Signed)
Reports good fm. Denies uc's, lof, vb, urinary frequency, urgency, hesitancy, or dysuria.  No complaints.  Reviewed ptl s/s, fetal kick counts.  All questions answered.  PN2 today. F/U in 4wks for visit.  

## 2013-04-20 NOTE — Patient Instructions (Signed)
fPregnancy - Third Trimester The third trimester of pregnancy (the last 3 months) is a period of the most rapid growth for you and your baby. The baby approaches a length of 20 inches and a weight of 6 to 10 pounds. The baby is adding on fat and getting ready for life outside your body. While inside, babies have periods of sleeping and waking, sucking thumbs, and hiccuping. You can often feel small contractions of the uterus. This is false labor. It is also called Braxton-Hicks contractions. This is like a practice for labor. The usual problems in this stage of pregnancy include more difficulty breathing, swelling of the hands and feet from water retention, and having to urinate more often because of the uterus and baby pressing on your bladder.  PRENATAL EXAMS  Blood work may continue to be done during prenatal exams. These tests are done to check on your health and the probable health of your baby. Blood work is used to follow your blood levels (hemoglobin). Anemia (low hemoglobin) is common during pregnancy. Iron and vitamins are given to help prevent this. You may also continue to be checked for diabetes. Some of the past blood tests may be done again.  The size of the uterus is measured during each visit. This makes sure your baby is growing properly according to your pregnancy dates.  Your blood pressure is checked every prenatal visit. This is to make sure you are not getting toxemia.  Your urine is checked every prenatal visit for infection, diabetes, and protein.  Your weight is checked at each visit. This is done to make sure gains are happening at the suggested rate and that you and your baby are growing normally.  Sometimes, an ultrasound is performed to confirm the position and the proper growth and development of the baby. This is a test done that bounces harmless sound waves off the baby so your caregiver can more accurately determine a due date.  Discuss the type of pain medicine and  anesthesia you will have during your labor and delivery.  Discuss the possibility and anesthesia if a cesarean section might be necessary.  Inform your caregiver if there is any mental or physical violence at home. Sometimes, a specialized non-stress test, contraction stress test, and biophysical profile are done to make sure the baby is not having a problem. Checking the amniotic fluid surrounding the baby is called an amniocentesis. The amniotic fluid is removed by sticking a needle into the belly (abdomen). This is sometimes done near the end of pregnancy if an early delivery is required. In this case, it is done to help make sure the baby's lungs are mature enough for the baby to live outside of the womb. If the lungs are not mature and it is unsafe to deliver the baby, an injection of cortisone medicine is given to the mother 1 to 2 days before the delivery. This helps the baby's lungs mature and makes it safer to deliver the baby. CHANGES OCCURING IN THE THIRD TRIMESTER OF PREGNANCY Your body goes through many changes during pregnancy. They vary from person to person. Talk to your caregiver about changes you notice and are concerned about.  During the last trimester, you have probably had an increase in your appetite. It is normal to have cravings for certain foods. This varies from person to person and pregnancy to pregnancy.  You may begin to get stretch marks on your hips, abdomen, and breasts. These are normal changes in the body  during pregnancy. There are no exercises or medicines to take which prevent this change.  Constipation may be treated with a stool softener or adding bulk to your diet. Drinking lots of fluids, fiber in vegetables, fruits, and whole grains are helpful.  Exercising is also helpful. If you have been very active up until your pregnancy, most of these activities can be continued during your pregnancy. If you have been less active, it is helpful to start an exercise  program such as walking. Consult your caregiver before starting exercise programs.  Avoid all smoking, alcohol, non-prescribed drugs, herbs and "street drugs" during your pregnancy. These chemicals affect the formation and growth of the baby. Avoid chemicals throughout the pregnancy to ensure the delivery of a healthy infant.  Backache, varicose veins, and hemorrhoids may develop or get worse.  You will tire more easily in the third trimester, which is normal.  The baby's movements may be stronger and more often.  You may become short of breath easily.  Your belly button may stick out.  A yellow discharge may leak from your breasts called colostrum.  You may have a bloody mucus discharge. This usually occurs a few days to a week before labor begins. HOME CARE INSTRUCTIONS   Keep your caregiver's appointments. Follow your caregiver's instructions regarding medicine use, exercise, and diet.  During pregnancy, you are providing food for you and your baby. Continue to eat regular, well-balanced meals. Choose foods such as meat, fish, milk and other low fat dairy products, vegetables, fruits, and whole-grain breads and cereals. Your caregiver will tell you of the ideal weight gain.  A physical sexual relationship may be continued throughout pregnancy if there are no other problems such as early (premature) leaking of amniotic fluid from the membranes, vaginal bleeding, or belly (abdominal) pain.  Exercise regularly if there are no restrictions. Check with your caregiver if you are unsure of the safety of your exercises. Greater weight gain will occur in the last 2 trimesters of pregnancy. Exercising helps:  Control your weight.  Get you in shape for labor and delivery.  You lose weight after you deliver.  Rest a lot with legs elevated, or as needed for leg cramps or low back pain.  Wear a good support or jogging bra for breast tenderness during pregnancy. This may help if worn during  sleep. Pads or tissues may be used in the bra if you are leaking colostrum.  Do not use hot tubs, steam rooms, or saunas.  Wear your seat belt when driving. This protects you and your baby if you are in an accident.  Avoid raw meat, cat litter boxes and soil used by cats. These carry germs that can cause birth defects in the baby.  It is easier to leak urine during pregnancy. Tightening up and strengthening the pelvic muscles will help with this problem. You can practice stopping your urination while you are going to the bathroom. These are the same muscles you need to strengthen. It is also the muscles you would use if you were trying to stop from passing gas. You can practice tightening these muscles up 10 times a set and repeating this about 3 times per day. Once you know what muscles to tighten up, do not perform these exercises during urination. It is more likely to cause an infection by backing up the urine.  Ask for help if you have financial, counseling, or nutritional needs during pregnancy. Your caregiver will be able to offer counseling for these   needs as well as refer you for other special needs.  Make a list of emergency phone numbers and have them available.  Plan on getting help from family or friends when you go home from the hospital.  Make a trial run to the hospital.  Take prenatal classes with the father to understand, practice, and ask questions about the labor and delivery.  Prepare the baby's room or nursery.  Do not travel out of the city unless it is absolutely necessary and with the advice of your caregiver.  Wear only low or no heal shoes to have better balance and prevent falling. MEDICINES AND DRUG USE IN PREGNANCY  Take prenatal vitamins as directed. The vitamin should contain 1 milligram of folic acid. Keep all vitamins out of reach of children. Only a couple vitamins or tablets containing iron may be fatal to a baby or young child when ingested.  Avoid use  of all medicines, including herbs, over-the-counter medicines, not prescribed or suggested by your caregiver. Only take over-the-counter or prescription medicines for pain, discomfort, or fever as directed by your caregiver. Do not use aspirin, ibuprofen or naproxen unless approved by your caregiver.  Let your caregiver also know about herbs you may be using.  Alcohol is related to a number of birth defects. This includes fetal alcohol syndrome. All alcohol, in any form, should be avoided completely. Smoking will cause low birth rate and premature babies.  Illegal drugs are very harmful to the baby. They are absolutely forbidden. A baby born to an addicted mother will be addicted at birth. The baby will go through the same withdrawal an adult does. SEEK MEDICAL CARE IF: You have any concerns or worries during your pregnancy. It is better to call with your questions if you feel they cannot wait, rather than worry about them. SEEK IMMEDIATE MEDICAL CARE IF:   An unexplained oral temperature above 102 F (38.9 C) develops, or as your caregiver suggests.  You have leaking of fluid from the vagina. If leaking membranes are suspected, take your temperature and tell your caregiver of this when you call.  There is vaginal spotting, bleeding or passing clots. Tell your caregiver of the amount and how many pads are used.  You develop a bad smelling vaginal discharge with a change in the color from clear to white.  You develop vomiting that lasts more than 24 hours.  You develop chills or fever.  You develop shortness of breath.  You develop burning on urination.  You loose more than 2 pounds of weight or gain more than 2 pounds of weight or as suggested by your caregiver.  You notice sudden swelling of your face, hands, and feet or legs.  You develop belly (abdominal) pain. Round ligament discomfort is a common non-cancerous (benign) cause of abdominal pain in pregnancy. Your caregiver still  must evaluate you.  You develop a severe headache that does not go away.  You develop visual problems, blurred or double vision.  If you have not felt your baby move for more than 1 hour. If you think the baby is not moving as much as usual, eat something with sugar in it and lie down on your left side for an hour. The baby should move at least 4 to 5 times per hour. Call right away if your baby moves less than that.  You fall, are in a car accident, or any kind of trauma.  There is mental or physical violence at home. Document Released: 09/03/2001   Document Revised: 06/03/2012 Document Reviewed: 03/08/2009 [employer] Patient Information 2014 [employer].  

## 2013-04-21 ENCOUNTER — Encounter: Payer: Self-pay | Admitting: Women's Health

## 2013-04-21 LAB — ANTIBODY SCREEN: Antibody Screen: NEGATIVE

## 2013-04-21 LAB — RPR

## 2013-04-21 LAB — GLUCOSE TOLERANCE, 2 HOURS W/ 1HR
Glucose, 1 hour: 109 mg/dL (ref 70–170)
Glucose, Fasting: 83 mg/dL (ref 70–99)

## 2013-05-18 ENCOUNTER — Encounter: Payer: Self-pay | Admitting: Women's Health

## 2013-05-18 ENCOUNTER — Ambulatory Visit (INDEPENDENT_AMBULATORY_CARE_PROVIDER_SITE_OTHER): Payer: Medicaid Other | Admitting: Women's Health

## 2013-05-18 VITALS — BP 104/60 | Wt 145.4 lb

## 2013-05-18 DIAGNOSIS — Z331 Pregnant state, incidental: Secondary | ICD-10-CM

## 2013-05-18 DIAGNOSIS — Z1389 Encounter for screening for other disorder: Secondary | ICD-10-CM

## 2013-05-18 DIAGNOSIS — O0930 Supervision of pregnancy with insufficient antenatal care, unspecified trimester: Secondary | ICD-10-CM

## 2013-05-18 DIAGNOSIS — O99019 Anemia complicating pregnancy, unspecified trimester: Secondary | ICD-10-CM

## 2013-05-18 DIAGNOSIS — Z3483 Encounter for supervision of other normal pregnancy, third trimester: Secondary | ICD-10-CM

## 2013-05-18 LAB — POCT URINALYSIS DIPSTICK
Glucose, UA: NEGATIVE
Nitrite, UA: NEGATIVE

## 2013-05-18 NOTE — Progress Notes (Signed)
Reports good fm. Denies uc's, lof, vb, urinary frequency, urgency, hesitancy, or dysuria.  No complaints.  Reviewed ptl s/s, fkc, pn2 results.  All questions answered. F/U in 2wks for visit.  

## 2013-05-18 NOTE — Patient Instructions (Signed)

## 2013-06-04 ENCOUNTER — Encounter: Payer: Self-pay | Admitting: Obstetrics & Gynecology

## 2013-06-04 ENCOUNTER — Ambulatory Visit (INDEPENDENT_AMBULATORY_CARE_PROVIDER_SITE_OTHER): Payer: Medicaid Other | Admitting: Obstetrics & Gynecology

## 2013-06-04 VITALS — BP 110/50 | Wt 149.0 lb

## 2013-06-04 DIAGNOSIS — Z1389 Encounter for screening for other disorder: Secondary | ICD-10-CM

## 2013-06-04 DIAGNOSIS — O0930 Supervision of pregnancy with insufficient antenatal care, unspecified trimester: Secondary | ICD-10-CM

## 2013-06-04 DIAGNOSIS — O99019 Anemia complicating pregnancy, unspecified trimester: Secondary | ICD-10-CM

## 2013-06-04 DIAGNOSIS — Z331 Pregnant state, incidental: Secondary | ICD-10-CM

## 2013-06-04 LAB — POCT URINALYSIS DIPSTICK
Glucose, UA: NEGATIVE
Nitrite, UA: NEGATIVE

## 2013-06-04 NOTE — Progress Notes (Signed)
BP weight and urine results all reviewed and noted. Patient reports good fetal movement, denies any bleeding and no rupture of membranes symptoms or regular contractions. Patient is without complaints. All questions were answered.  

## 2013-06-18 ENCOUNTER — Encounter: Payer: Self-pay | Admitting: Obstetrics & Gynecology

## 2013-06-18 ENCOUNTER — Ambulatory Visit (INDEPENDENT_AMBULATORY_CARE_PROVIDER_SITE_OTHER): Payer: Medicaid Other | Admitting: Obstetrics & Gynecology

## 2013-06-18 VITALS — BP 100/60 | Temp 97.9°F | Wt 152.0 lb

## 2013-06-18 DIAGNOSIS — O99019 Anemia complicating pregnancy, unspecified trimester: Secondary | ICD-10-CM

## 2013-06-18 DIAGNOSIS — Z1389 Encounter for screening for other disorder: Secondary | ICD-10-CM

## 2013-06-18 DIAGNOSIS — Z331 Pregnant state, incidental: Secondary | ICD-10-CM

## 2013-06-18 DIAGNOSIS — O0930 Supervision of pregnancy with insufficient antenatal care, unspecified trimester: Secondary | ICD-10-CM

## 2013-06-18 DIAGNOSIS — Z3483 Encounter for supervision of other normal pregnancy, third trimester: Secondary | ICD-10-CM

## 2013-06-18 LAB — POCT URINALYSIS DIPSTICK
Ketones, UA: NEGATIVE
Nitrite, UA: NEGATIVE
Protein, UA: NEGATIVE

## 2013-06-18 NOTE — Progress Notes (Signed)
BP weight and urine results all reviewed and noted. Patient reports good fetal movement, denies any bleeding and no rupture of membranes symptoms or regular contractions. Patient is without complaints. All questions were answered.  

## 2013-06-18 NOTE — Progress Notes (Signed)
For GBS/GC/CHL. NASAL CONGESTION  X 3 day.

## 2013-06-21 ENCOUNTER — Telehealth: Payer: Self-pay | Admitting: Obstetrics & Gynecology

## 2013-06-21 ENCOUNTER — Encounter (HOSPITAL_COMMUNITY): Payer: Self-pay | Admitting: *Deleted

## 2013-06-21 ENCOUNTER — Inpatient Hospital Stay (HOSPITAL_COMMUNITY)
Admission: AD | Admit: 2013-06-21 | Discharge: 2013-06-21 | Disposition: A | Discharge status: Home / Self Care | Payer: Medicaid Other | Source: Ambulatory Visit | Attending: Obstetrics & Gynecology | Admitting: Obstetrics & Gynecology

## 2013-06-21 DIAGNOSIS — M549 Dorsalgia, unspecified: Secondary | ICD-10-CM | POA: Insufficient documentation

## 2013-06-21 DIAGNOSIS — N949 Unspecified condition associated with female genital organs and menstrual cycle: Secondary | ICD-10-CM | POA: Insufficient documentation

## 2013-06-21 DIAGNOSIS — O99891 Other specified diseases and conditions complicating pregnancy: Secondary | ICD-10-CM | POA: Insufficient documentation

## 2013-06-21 LAB — URINALYSIS, ROUTINE W REFLEX MICROSCOPIC
Glucose, UA: NEGATIVE mg/dL
Hgb urine dipstick: NEGATIVE
Ketones, ur: 15 mg/dL — AB
pH: 6 (ref 5.0–8.0)

## 2013-06-21 LAB — URINE MICROSCOPIC-ADD ON

## 2013-06-21 NOTE — Telephone Encounter (Signed)
Spoke with pt. Was told she was 4 cm on Friday. Has had backpain all night. Unable to sleep good. Stomach tightens up, has happened 4 times this am. + baby movement. Advised to go to [facility] for eval. Pt voiced understanding. JSY

## 2013-06-21 NOTE — [facility] Note (Signed)
Patient states she has been having constant back pain. Has a vaginal discharge, no bleeding. Reports good fetal movement.

## 2013-06-25 ENCOUNTER — Ambulatory Visit (INDEPENDENT_AMBULATORY_CARE_PROVIDER_SITE_OTHER): Payer: Medicaid Other | Admitting: Obstetrics and Gynecology

## 2013-06-25 ENCOUNTER — Encounter: Payer: Self-pay | Admitting: Obstetrics and Gynecology

## 2013-06-25 VITALS — BP 100/62 | Wt 154.0 lb

## 2013-06-25 DIAGNOSIS — O99019 Anemia complicating pregnancy, unspecified trimester: Secondary | ICD-10-CM

## 2013-06-25 DIAGNOSIS — O0930 Supervision of pregnancy with insufficient antenatal care, unspecified trimester: Secondary | ICD-10-CM

## 2013-06-25 DIAGNOSIS — O99891 Other specified diseases and conditions complicating pregnancy: Secondary | ICD-10-CM

## 2013-06-25 DIAGNOSIS — Z1389 Encounter for screening for other disorder: Secondary | ICD-10-CM

## 2013-06-25 DIAGNOSIS — Z331 Pregnant state, incidental: Secondary | ICD-10-CM

## 2013-06-25 DIAGNOSIS — Z3483 Encounter for supervision of other normal pregnancy, third trimester: Secondary | ICD-10-CM

## 2013-06-25 LAB — POCT URINALYSIS DIPSTICK
Glucose, UA: NEGATIVE
Ketones, UA: NEGATIVE
Protein, UA: NEGATIVE

## 2013-06-25 NOTE — Progress Notes (Signed)
Seen at [facility], no changes, cervix very elastic, soft, posterior,-2 . No srom. Good FM, no voiced concerns. Kick counts reviewed.

## 2013-06-25 NOTE — Progress Notes (Signed)
Pt here today for routine visit. Pt states she has been having some irregular contractions and some back pain. Pt denies any other problems or concerns at this time.

## 2013-07-01 ENCOUNTER — Encounter (HOSPITAL_COMMUNITY): Payer: Medicaid Other | Admitting: Anesthesiology

## 2013-07-01 ENCOUNTER — Inpatient Hospital Stay (HOSPITAL_COMMUNITY)
Admission: AD | Admit: 2013-07-01 | Discharge: 2013-07-03 | DRG: 775 | Disposition: A | Discharge status: Home / Self Care | Payer: Medicaid Other | Source: Ambulatory Visit | Attending: Obstetrics & Gynecology | Admitting: Obstetrics & Gynecology

## 2013-07-01 ENCOUNTER — Inpatient Hospital Stay (HOSPITAL_COMMUNITY): Payer: Medicaid Other | Admitting: Anesthesiology

## 2013-07-01 ENCOUNTER — Encounter (HOSPITAL_COMMUNITY): Payer: Self-pay

## 2013-07-01 DIAGNOSIS — O99892 Other specified diseases and conditions complicating childbirth: Secondary | ICD-10-CM | POA: Diagnosis present

## 2013-07-01 DIAGNOSIS — O4290 Premature rupture of membranes, unspecified as to length of time between rupture and onset of labor, unspecified weeks of gestation: Secondary | ICD-10-CM

## 2013-07-01 DIAGNOSIS — Z3480 Encounter for supervision of other normal pregnancy, unspecified trimester: Secondary | ICD-10-CM

## 2013-07-01 DIAGNOSIS — Z22330 Carrier of Group B streptococcus: Secondary | ICD-10-CM

## 2013-07-01 LAB — CBC
HCT: 34.7 % — ABNORMAL LOW (ref 36.0–46.0)
Hemoglobin: 11.7 g/dL — ABNORMAL LOW (ref 12.0–15.0)
MCH: 29.3 pg (ref 26.0–34.0)
MCHC: 33.7 g/dL (ref 30.0–36.0)
MCV: 87 fL (ref 78.0–100.0)
RDW: 12.8 % (ref 11.5–15.5)
WBC: 14.7 10*3/uL — ABNORMAL HIGH (ref 4.0–10.5)

## 2013-07-01 MED ORDER — EPHEDRINE 5 MG/ML INJ
10.0000 mg | INTRAVENOUS | Status: DC | PRN
  Filled 2013-07-01: qty 2
  Filled 2013-07-01: qty 4

## 2013-07-01 MED ORDER — TERBUTALINE SULFATE 1 MG/ML IJ SOLN: 0.2500 mg | Freq: Once | INTRAMUSCULAR | Status: DC | PRN

## 2013-07-01 MED ORDER — OXYCODONE-ACETAMINOPHEN 5-325 MG PO TABS: 1.0000 | ORAL_TABLET | ORAL | Status: DC | PRN

## 2013-07-01 MED ORDER — LACTATED RINGERS IV SOLN
INTRAVENOUS | Status: DC
  Administered 2013-07-01 (×2): via INTRAVENOUS

## 2013-07-01 MED ORDER — OXYTOCIN 40 UNITS IN LACTATED RINGERS INFUSION - SIMPLE MED: 62.5000 mL/h | INTRAVENOUS | Status: DC

## 2013-07-01 MED ORDER — OXYTOCIN 40 UNITS IN LACTATED RINGERS INFUSION - SIMPLE MED
1.0000 m[IU]/min | INTRAVENOUS | Status: DC
  Administered 2013-07-01: 2 m[IU]/min via INTRAVENOUS
  Filled 2013-07-01: qty 1000

## 2013-07-01 MED ORDER — IBUPROFEN 600 MG PO TABS
600.0000 mg | ORAL_TABLET | Freq: Four times a day (QID) | ORAL | Status: DC | PRN
  Administered 2013-07-01: 600 mg via ORAL
  Filled 2013-07-01: qty 1

## 2013-07-01 MED ORDER — CITRIC ACID-SODIUM CITRATE 334-500 MG/5ML PO SOLN: 30.0000 mL | ORAL | Status: DC | PRN

## 2013-07-01 MED ORDER — PHENYLEPHRINE 40 MCG/ML (10ML) SYRINGE FOR IV PUSH (FOR BLOOD PRESSURE SUPPORT)
80.0000 ug | PREFILLED_SYRINGE | INTRAVENOUS | Status: DC | PRN
  Filled 2013-07-01: qty 2

## 2013-07-01 MED ORDER — ONDANSETRON HCL 4 MG PO TABS: 4.0000 mg | ORAL_TABLET | ORAL | Status: DC | PRN

## 2013-07-01 MED ORDER — ACETAMINOPHEN 325 MG PO TABS: 650.0000 mg | ORAL_TABLET | ORAL | Status: DC | PRN

## 2013-07-01 MED ORDER — ONDANSETRON HCL 4 MG/2ML IJ SOLN: 4.0000 mg | Freq: Four times a day (QID) | INTRAMUSCULAR | Status: DC | PRN

## 2013-07-01 MED ORDER — LIDOCAINE HCL (PF) 1 % IJ SOLN
30.0000 mL | INTRAMUSCULAR | Status: DC | PRN
  Filled 2013-07-01 (×2): qty 30

## 2013-07-01 MED ORDER — WITCH HAZEL-GLYCERIN EX PADS
1.0000 "application " | MEDICATED_PAD | CUTANEOUS | Status: DC | PRN
  Administered 2013-07-02: 1 via TOPICAL

## 2013-07-01 MED ORDER — SODIUM BICARBONATE 8.4 % IV SOLN
INTRAVENOUS | Status: DC | PRN
  Administered 2013-07-01: 5 mL via EPIDURAL

## 2013-07-01 MED ORDER — PHENYLEPHRINE 40 MCG/ML (10ML) SYRINGE FOR IV PUSH (FOR BLOOD PRESSURE SUPPORT)
80.0000 ug | PREFILLED_SYRINGE | INTRAVENOUS | Status: DC | PRN
  Filled 2013-07-01: qty 5
  Filled 2013-07-01: qty 2

## 2013-07-01 MED ORDER — DIBUCAINE 1 % RE OINT: 1.0000 "application " | TOPICAL_OINTMENT | RECTAL | Status: DC | PRN

## 2013-07-01 MED ORDER — ONDANSETRON HCL 4 MG/2ML IJ SOLN: 4.0000 mg | INTRAMUSCULAR | Status: DC | PRN

## 2013-07-01 MED ORDER — OXYTOCIN BOLUS FROM INFUSION: 500.0000 mL | INTRAVENOUS | Status: DC

## 2013-07-01 MED ORDER — DIPHENHYDRAMINE HCL 25 MG PO CAPS: 25.0000 mg | ORAL_CAPSULE | Freq: Four times a day (QID) | ORAL | Status: DC | PRN

## 2013-07-01 MED ORDER — LANOLIN HYDROUS EX OINT: TOPICAL_OINTMENT | CUTANEOUS | Status: DC | PRN

## 2013-07-01 MED ORDER — SIMETHICONE 80 MG PO CHEW: 80.0000 mg | CHEWABLE_TABLET | ORAL | Status: DC | PRN

## 2013-07-01 MED ORDER — LACTATED RINGERS IV SOLN
500.0000 mL | Freq: Once | INTRAVENOUS | Status: AC
  Administered 2013-07-01: 500 mL via INTRAVENOUS

## 2013-07-01 MED ORDER — EPHEDRINE 5 MG/ML INJ
10.0000 mg | INTRAVENOUS | Status: DC | PRN
  Filled 2013-07-01: qty 2

## 2013-07-01 MED ORDER — ZOLPIDEM TARTRATE 5 MG PO TABS: 5.0000 mg | ORAL_TABLET | Freq: Every evening | ORAL | Status: DC | PRN

## 2013-07-01 MED ORDER — PRENATAL MULTIVITAMIN CH
1.0000 | ORAL_TABLET | Freq: Every day | ORAL | Status: DC
  Administered 2013-07-02: 1 via ORAL
  Filled 2013-07-01: qty 1

## 2013-07-01 MED ORDER — FENTANYL 2.5 MCG/ML BUPIVACAINE 1/10 % EPIDURAL INFUSION (WH - ANES)
14.0000 mL/h | INTRAMUSCULAR | Status: DC | PRN
  Administered 2013-07-01: 14 mL/h via EPIDURAL
  Filled 2013-07-01: qty 125

## 2013-07-01 MED ORDER — BENZOCAINE-MENTHOL 20-0.5 % EX AERO: 1.0000 "application " | INHALATION_SPRAY | CUTANEOUS | Status: DC | PRN

## 2013-07-01 MED ORDER — SENNOSIDES-DOCUSATE SODIUM 8.6-50 MG PO TABS
2.0000 | ORAL_TABLET | ORAL | Status: DC
  Administered 2013-07-02: 2 via ORAL
  Filled 2013-07-01 (×2): qty 2

## 2013-07-01 MED ORDER — PENICILLIN G POTASSIUM 5000000 UNITS IJ SOLR
5.0000 10*6.[IU] | Freq: Once | INTRAVENOUS | Status: AC
  Administered 2013-07-01: 5 10*6.[IU] via INTRAVENOUS
  Filled 2013-07-01: qty 5

## 2013-07-01 MED ORDER — LACTATED RINGERS IV SOLN: 500.0000 mL | INTRAVENOUS | Status: DC | PRN

## 2013-07-01 MED ORDER — DIPHENHYDRAMINE HCL 50 MG/ML IJ SOLN: 12.5000 mg | INTRAMUSCULAR | Status: DC | PRN

## 2013-07-01 MED ORDER — TETANUS-DIPHTH-ACELL PERTUSSIS 5-2.5-18.5 LF-MCG/0.5 IM SUSP: 0.5000 mL | Freq: Once | INTRAMUSCULAR | Status: DC

## 2013-07-01 MED ORDER — FLEET ENEMA 7-19 GM/118ML RE ENEM: 1.0000 | ENEMA | RECTAL | Status: DC | PRN

## 2013-07-01 MED ORDER — PENICILLIN G POTASSIUM 5000000 UNITS IJ SOLR
2.5000 10*6.[IU] | INTRAVENOUS | Status: DC
  Administered 2013-07-01 (×3): 2.5 10*6.[IU] via INTRAVENOUS
  Filled 2013-07-01 (×6): qty 2.5

## 2013-07-01 MED ORDER — IBUPROFEN 600 MG PO TABS
600.0000 mg | ORAL_TABLET | Freq: Four times a day (QID) | ORAL | Status: DC
  Administered 2013-07-02 – 2013-07-03 (×5): 600 mg via ORAL
  Filled 2013-07-01 (×6): qty 1

## 2013-07-01 NOTE — MAU Note (Signed)
Large gush of clear fluid at 11pm tonight. Denies contractions or vaginal bleeding. Positive fetal movement.

## 2013-07-01 NOTE — Anesthesia Procedure Notes (Signed)

## 2013-07-01 NOTE — MAU Provider Note (Signed)
History     CSN: [id number]  Arrival date and time: 07/01/13 0017   None     Chief Complaint  Patient presents with  . Rupture of Membranes   HPI  Chelsea Strong is a 22y.o G2P1001 at 38w6 by LMP confirmed by [place] who presents with SROM at 11pm tonight with large gush of fluid that woke pt from sleeping. Abd pain described as 5/10, happening more than than 5 mins apart lasting approx 30 sec. Intermittent, no consistent pattern. No vag bleeding. Good fetal movement. Took long walk yesterday. Has had recent URI symptoms but otherwise in normal state of health.  Last cervical exam approx 4cm on sept 26th. Follows at [facility]. Neg genetic screen. Nml 2hr glucose screen, normal anatomic [place].   OB History   Grav Para Term Preterm Abortions TAB SAB Ect Mult Living   2 1 1       1      1. Term NSVD, at 39w5; 6lbs 7oz 2. Current  Past Medical History  Diagnosis Date  . History of trichomoniasis   . History of chlamydia     Past Surgical History  Procedure Laterality Date  . No past surgeries      Family History  Problem Relation Age of Onset  . Asthma Father   . Cancer Paternal Aunt     breast    History  Substance Use Topics  . Smoking status: Never Smoker   . Smokeless tobacco: Never Used  . Alcohol Use: No    Allergies: No Known Allergies  Prescriptions prior to admission  Medication Sig Dispense Refill  . Prenatal Vit-Fe Fumarate-FA (PRENATAL MULTIVITAMIN) TABS tablet Take 1 tablet by mouth daily at 12 noon.        Review of Systems  Constitutional: Negative for fever, chills and malaise/fatigue.  HENT: Positive for congestion and sore throat.   Eyes: Negative for blurred vision and pain.  Respiratory: Negative for cough, shortness of breath and wheezing.   Cardiovascular: Negative for chest pain and palpitations.  Gastrointestinal: Negative for heartburn, nausea and vomiting.  Genitourinary: Negative for dysuria, urgency and frequency.  Musculoskeletal:  Negative for myalgias.  Skin: Negative for rash.  Neurological: Negative for dizziness, seizures, loss of consciousness and headaches.  Endo/Heme/Allergies: Negative for environmental allergies. Does not bruise/bleed easily.  Psychiatric/Behavioral: Negative for depression.   Physical Exam   Blood pressure 125/79, pulse 94, temperature 99 F (37.2 C), temperature source Oral, resp. rate 18, height 5\' 2"  (1.575 m), weight 70.58 kg (155 lb 9.6 oz), last menstrual period 10/02/2012, SpO2 100.00%.  Physical Exam  Constitutional: She is oriented to person, place, and time. She appears well-developed and well-nourished. No distress.  HENT:  Head: Normocephalic and atraumatic.  Mouth/Throat: Oropharynx is clear and moist. No oropharyngeal exudate.  Eyes: EOM are normal. Pupils are equal, round, and reactive to light.  Neck: Normal range of motion. Neck supple.  Cardiovascular: Normal rate and regular rhythm.   Murmur heard. Respiratory: Effort normal and breath sounds normal. No respiratory distress. She has no wheezes.  GI: Soft. Bowel sounds are normal. There is no tenderness.  Musculoskeletal: Normal range of motion.  Neurological: She is alert and oriented to person, place, and time.  Skin: Skin is warm and dry.  Psychiatric: She has a normal mood and affect.    MAU Course  Procedures  MDM Positive fern  Assessment and Plan  SROM at 11pm tonight, not in active labor admit to L&D See L&D note for  a/p  Anselm Lis 07/01/2013, 1:24 AM   I have seen and examined this patient and I agree with the above. Jaleen Finch 2:12 AM 07/01/2013

## 2013-07-01 NOTE — H&P (Signed)
Attestation of Attending Supervision of Advanced Practitioner: Evaluation and management procedures were performed by the PA/NP/CNM/OB Fellow under my supervision/collaboration. Chart reviewed and agree with management and plan.  Hatcher Froning V 07/01/2013 3:41 AM

## 2013-07-01 NOTE — Anesthesia Preprocedure Evaluation (Signed)

## 2013-07-01 NOTE — H&P (Signed)
Chelsea Strong is a 22 y.o. female G2P1001 with IUP at [redacted]w[redacted]d by LMP, confirmed by [place] presenting for SROM at 11pm tonight with large gush of fluid that woke pt from sleeping. Pt states she has been having abdominal cramping at irregular intervals >5 mins, lasting 30 sec or less not associated with vaginal bleeding. with active fetal movement.    PNCare at [facility] since 12wks. Normal [place]. Neg genetic screen. Nml 2hr glucose screen. Neg GCCT.HIV NR. Last cervical exam approx 3cm on 06/25/13.  Prenatal History/Complications:  Past Medical History: Past Medical History  Diagnosis Date  . History of trichomoniasis   . History of chlamydia     Past Surgical History: Past Surgical History  Procedure Laterality Date  . No past surgeries      Obstetrical History: OB History   Grav Para Term Preterm Abortions TAB SAB Ect Mult Living   2 1 1       1       Gynecological History: Pertinent Gynecological History: Menses: flow is moderate LMP: 10/02/12  Social History: History   Social History  . Marital Status: Single    Spouse Name: N/A    Number of Children: N/A  . Years of Education: N/A   Social History Main Topics  . Smoking status: Never Smoker   . Smokeless tobacco: Never Used  . Alcohol Use: No  . Drug Use: No  . Sexual Activity: Yes    Birth Control/ Protection: None   Other Topics Concern  . None   Social History Narrative  . None    Family History: Family History  Problem Relation Age of Onset  . Asthma Father   . Cancer Paternal Aunt     breast    Allergies: No Known Allergies  Prescriptions prior to admission  Medication Sig Dispense Refill  . Prenatal Vit-Fe Fumarate-FA (PRENATAL MULTIVITAMIN) TABS tablet Take 1 tablet by mouth daily at 12 noon.         Review of Systems   Constitutional: Negative for fever, chills, weight loss, malaise/fatigue and diaphoresis.  HENT: Negative for hearing loss, ear pain, nosebleeds, congestion, sore throat, neck  pain, tinnitus and ear discharge.   Eyes: Negative for blurred vision, double vision, photophobia, pain, discharge and redness.  Respiratory: Negative for cough, hemoptysis, sputum production, shortness of breath, wheezing and stridor.   Cardiovascular: Negative for chest pain, palpitations, orthopnea,  leg swelling  Gastrointestinal: Positive for abdominal pain. Negative for heartburn, nausea, vomiting, diarrhea, constipation, blood in stool Genitourinary: Negative for dysuria, urgency, frequency, hematuria and flank pain.  Musculoskeletal: Negative for myalgias, back pain, joint pain and falls.  Skin: Negative for itching and rash.  Neurological: Negative for dizziness, tingling, tremors, sensory change, speech change, focal weakness, seizures, loss of consciousness, weakness and headaches.  Endo/Heme/Allergies: Negative for environmental allergies and polydipsia. Does not bruise/bleed easily.  Psychiatric/Behavioral: Negative for depression, suicidal ideas, hallucinations, memory loss and substance abuse. The patient is not nervous/anxious and does not have insomnia.      Blood pressure 125/79, pulse 94, temperature 99 F (37.2 C), temperature source Oral, resp. rate 18, height 5\' 2"  (1.575 m), weight 70.58 kg (155 lb 9.6 oz), last menstrual period 10/02/2012, SpO2 100.00%. General appearance: alert, cooperative and no distress Lungs: clear to auscultation bilaterally Heart: regular rate and rhythm, flow murmur Abdomen: soft, non-tender; gravid, bowel sounds normal Pelvic: post fern, deferred cervical exam Extremities: Homans sign is negative, no sign of DVT DTR's nml Presentation: cephalic  Prenatal labs: ABO, Rh: --/--/A POS (04/01 1540) Antibody: NEG (07/29 0920) Rubella:   RPR: NON REAC (07/29 0920)  HBsAg: Negative (03/06 0000)  HIV: NON REACTIVE (07/29 0920)  GBS: POSITIVE (09/26 1202)  2 hr Glucola nml  Genetic screening  neg Anatomy US nml   Recent Labs Lab  07/01/13 0155  HGB 11.7*    Assessment: AVARY EICHENBERGER is a 22 y.o. G2P1001 with an IUP at [redacted]w[redacted]d presenting for premature ROM, not in active labor  Plan: 1. SROM- admit to L&D -routine orders  -HIV NR, GBS post -prev NVSD at term -expect SVD -not currently in pain, may plan for epidural if pt desires -no augmentation at this time -plan for cervical check on L&D, last noted to be 3cm  2. GBS + -Pen U, followed by 2.5  3. Postnatal care -plans for breast feeding -desires circ -depo for contraception  Chelsea Lis, MD 07/01/2013, 2:06 AM  I have seen and examined this patient and I agree with the above. Vinicius Brockman 2:25 AM 07/01/2013

## 2013-07-02 ENCOUNTER — Encounter: Payer: Medicaid Other | Admitting: Obstetrics and Gynecology

## 2013-07-02 MED ORDER — INFLUENZA VAC SPLIT QUAD 0.5 ML IM SUSP: 0.5000 mL | INTRAMUSCULAR | Status: DC

## 2013-07-02 NOTE — Lactation Note (Signed)
This note was copied from the chart of Chelsea Strong. Lactation Consultation Note  Patient Name: Chelsea Strong Date: 07/02/2013 Reason for consult: Follow-up assessment   Maternal Data Reason for exclusion: Mother's choice to formula and breast feed on admission (mom now states she will only formula-feed).  Mom has been formula/bottle-feeding and informs this LC that she has decided she no longer wants to breastfeed.  Feeding    LATCH Score/Interventions         N/A             Lactation Tools Discussed/Used     Consult Status Consult Status: Complete    Lynda Rainwater 07/02/2013, 8:37 PM

## 2013-07-02 NOTE — Anesthesia Postprocedure Evaluation (Signed)
  Anesthesia Post-op Note  Patient: Chelsea Strong  Procedure(s) Performed: * No procedures listed *  Patient Location: Mother/Baby  Anesthesia Type:Epidural  Level of Consciousness: awake, alert , oriented and patient cooperative  Airway and Oxygen Therapy: Patient Spontanous Breathing  Post-op Pain: mild  Post-op Assessment: Patient's Cardiovascular Status Stable and Respiratory Function Stable  Post-op Vital Signs: stable  Complications: No apparent anesthesia complications

## 2013-07-02 NOTE — Progress Notes (Addendum)
Post Partum Day 1 Subjective: no complaints, up ad lib, voiding, tolerating PO, + flatus and BM last night.   Objective: Blood pressure 119/61, pulse 85, temperature 97.9 F (36.6 C), temperature source Oral, resp. rate 18, height 5\' 2"  (1.575 m), weight 70.308 kg (155 lb), last menstrual period 10/02/2012, SpO2 100.00%, unknown if currently breastfeeding.  Physical Exam:  General: alert, cooperative and appears stated age Lochia: appropriate Uterine Fundus: firm Incision: N/A DVT Evaluation: No evidence of DVT seen on physical exam. Negative Homan's sign. No cords or calf tenderness.   Recent Labs  07/01/13 0155  HGB 11.7*  HCT 34.7*   Chelsea Strong is a 22 y.o. female G2P1001 with IUP at [redacted]w[redacted]d by LMP, confirmed by [place] presented for SROM the night prior to delivery. She was GBS positive and was treated adequately. She was given pitocin per protocol and delivered without complications.   Assessment/Plan: Plan for discharge tomorrow Bottle/breast feeding  Circ at [facility] Depo shot for [facility]   LOS: 1 day   Kuneff, Renee 07/02/2013, 7:30 AM   I have seen and examined this patient and agree the above assessment. CRESENZO-DISHMAN,Lesette Frary 07/02/2013 7:39 AM

## 2013-07-02 NOTE — Progress Notes (Signed)
UR completed 

## 2013-07-03 MED ORDER — IBUPROFEN 600 MG PO TABS: 600.0000 mg | ORAL_TABLET | Freq: Four times a day (QID) | ORAL | Status: DC

## 2013-07-03 NOTE — Discharge Summary (Signed)
Obstetric Discharge Summary Reason for Admission: rupture of membranes Prenatal Procedures: none Intrapartum Procedures: spontaneous vaginal delivery Postpartum Procedures: none Complications-Operative and Postpartum: none Hemoglobin  Date Value Range Status  07/01/2013 11.7* 12.0 - 15.0 g/dL Final     HCT  Date Value Range Status  07/01/2013 34.7* 36.0 - 46.0 % Final   Chelsea Strong is a 22yo G2P1001 who presented on 07/01/13 at 38.6wks with PROM. She was admitted to [facility] and required Pitocin for augmentation before progressing to SVD that same evening. Her PP stay was uneventful and she has been deemed to have received the full benefit of her hospital stay and will be discharged home. She is bottlefeeding and plans to use Depo for contraception.  Physical Exam:  General: alert, cooperative and no distress Lungs: nl effort Heart: RRR Lochia: appropriate Uterine Fundus: firm DVT Evaluation: No evidence of DVT seen on physical exam.  Discharge Diagnoses: Term Pregnancy-delivered  Discharge Information: Date: 07/03/2013 Activity: pelvic rest Diet: routine Medications: PNV and Ibuprofen Condition: stable Instructions: refer to practice specific booklet Discharge to: home Follow-up Information   Follow up with [facility]. (Keep your postpartum appointment in 4-6 weeks.)    Contact information:   [address] [place] [address] [phone]      Newborn Data: Live born female  Birth Weight: 7 lb 6.2 oz (3350 g) APGAR: 8, 9  Home with mother.  Cam Hai 07/03/2013, 8:44 AM

## 2013-07-08 NOTE — MAU Provider Note (Signed)
Attestation of Attending Supervision of Advanced Practitioner: Evaluation and management procedures were performed by the PA/NP/CNM/OB Fellow under my supervision/collaboration. Chart reviewed and agree with management and plan.  Patriciann Becht V 07/08/2013 10:32 PM

## 2013-08-13 ENCOUNTER — Ambulatory Visit: Payer: Medicaid Other | Admitting: Adult Health

## 2013-09-09 ENCOUNTER — Encounter: Payer: Self-pay | Admitting: *Deleted

## 2013-09-09 NOTE — Progress Notes (Unsigned)
Patient ID: Chelsea Strong, female   DOB: [birth year], 22 y.o.   MRN: [id number]   Called pt to rs pp appt but had to [facility].  09/09/13  AS

## 2013-09-23 NOTE — L&D Delivery Note (Signed)
I was present for delivery and agree with note above. Venia Carbon Michiel Cowboy, CNM

## 2013-09-23 NOTE — L&D Delivery Note (Signed)
Delivery Note At 6:02 AM a viable and healthy female was delivered via  (Presentation:OA).  APGAR:8 ,9 ; weight 6lb,5 oz.   Placenta status:Shultz intact , 3 vessel cord,  with the following complications: None .   Anesthesia:  None Lacerations: None Est. Blood Loss (mL): 300  Mom to postpartum.  Baby to Couplet care / Skin to Skin.  Doylene Bode [facility] 05/29/2014, 6:49 AM

## 2013-10-11 ENCOUNTER — Other Ambulatory Visit: Payer: Self-pay | Admitting: Obstetrics & Gynecology

## 2013-10-11 DIAGNOSIS — O3680X0 Pregnancy with inconclusive fetal viability, not applicable or unspecified: Secondary | ICD-10-CM

## 2013-10-13 ENCOUNTER — Encounter: Payer: Self-pay | Admitting: *Deleted

## 2013-10-13 ENCOUNTER — Other Ambulatory Visit: Payer: Medicaid Other

## 2013-11-17 ENCOUNTER — Other Ambulatory Visit: Payer: Medicaid Other

## 2013-12-01 ENCOUNTER — Encounter: Payer: Self-pay | Admitting: *Deleted

## 2013-12-01 ENCOUNTER — Other Ambulatory Visit: Payer: Medicaid Other

## 2013-12-23 ENCOUNTER — Other Ambulatory Visit: Payer: Self-pay | Admitting: Obstetrics & Gynecology

## 2013-12-23 ENCOUNTER — Ambulatory Visit (INDEPENDENT_AMBULATORY_CARE_PROVIDER_SITE_OTHER): Payer: Medicaid Other

## 2013-12-23 DIAGNOSIS — O3680X0 Pregnancy with inconclusive fetal viability, not applicable or unspecified: Secondary | ICD-10-CM

## 2013-12-23 DIAGNOSIS — O0930 Supervision of pregnancy with insufficient antenatal care, unspecified trimester: Secondary | ICD-10-CM

## 2013-12-23 DIAGNOSIS — O09899 Supervision of other high risk pregnancies, unspecified trimester: Secondary | ICD-10-CM

## 2013-12-23 NOTE — Progress Notes (Signed)
U/S(16+2wks)- active fetus, meas c/w dates, fluid wnl, anterior Gr 0 placenta, cx appears closed (3.3cm), bilateral adnexa wnl, female fetus, no major abnl noted, FHR- 155 bpm

## 2014-01-03 ENCOUNTER — Encounter: Payer: Self-pay | Admitting: Women's Health

## 2014-01-03 ENCOUNTER — Other Ambulatory Visit (HOSPITAL_COMMUNITY)
Admission: RE | Admit: 2014-01-03 | Discharge: 2014-01-03 | Disposition: A | Discharge status: Home / Self Care | Payer: Medicaid Other | Source: Ambulatory Visit | Attending: Obstetrics & Gynecology | Admitting: Obstetrics & Gynecology

## 2014-01-03 ENCOUNTER — Ambulatory Visit (INDEPENDENT_AMBULATORY_CARE_PROVIDER_SITE_OTHER): Payer: Medicaid Other | Admitting: Women's Health

## 2014-01-03 VITALS — BP 108/58 | Ht 61.0 in | Wt 134.0 lb

## 2014-01-03 DIAGNOSIS — O0930 Supervision of pregnancy with insufficient antenatal care, unspecified trimester: Secondary | ICD-10-CM

## 2014-01-03 DIAGNOSIS — O99019 Anemia complicating pregnancy, unspecified trimester: Secondary | ICD-10-CM

## 2014-01-03 DIAGNOSIS — O9989 Other specified diseases and conditions complicating pregnancy, childbirth and the puerperium: Secondary | ICD-10-CM

## 2014-01-03 DIAGNOSIS — O09899 Supervision of other high risk pregnancies, unspecified trimester: Secondary | ICD-10-CM

## 2014-01-03 DIAGNOSIS — Z113 Encounter for screening for infections with a predominantly sexual mode of transmission: Secondary | ICD-10-CM | POA: Insufficient documentation

## 2014-01-03 DIAGNOSIS — Z3480 Encounter for supervision of other normal pregnancy, unspecified trimester: Secondary | ICD-10-CM

## 2014-01-03 DIAGNOSIS — Z01419 Encounter for gynecological examination (general) (routine) without abnormal findings: Secondary | ICD-10-CM | POA: Insufficient documentation

## 2014-01-03 DIAGNOSIS — Z1389 Encounter for screening for other disorder: Secondary | ICD-10-CM

## 2014-01-03 DIAGNOSIS — O99891 Other specified diseases and conditions complicating pregnancy: Secondary | ICD-10-CM

## 2014-01-03 DIAGNOSIS — Z331 Pregnant state, incidental: Secondary | ICD-10-CM

## 2014-01-03 LAB — POCT URINALYSIS DIPSTICK
Glucose, UA: NEGATIVE
KETONES UA: NEGATIVE
LEUKOCYTES UA: NEGATIVE
NITRITE UA: NEGATIVE
Protein, UA: NEGATIVE

## 2014-01-03 NOTE — Progress Notes (Signed)
  Subjective:  Chelsea Strong is a 23 y.o. G81P2002 African American female at [redacted]w[redacted]d by LMP c/w 16wk u/s, being seen today for her first obstetrical visit.  Her obstetrical history is significant for short interval pregnancy- last birth Oct 1517, term uncomplicated svd x 2, late prenatal care.  This pregnancy was unplanned. She had planned on starting depo, but did not return for her pp visit after the birth of her son in Oct 2014. Pregnancy history fully reviewed.  Patient reports no complaints. Denies vb, cramping, uti s/s, abnormal/malodorous vag d/c, or vulvovaginal itching/irritation. No quickening yet.   BP 108/58  Wt 134 lb (60.782 kg)  LMP 08/31/2013  HISTORY: OB History  Gravida Para Term Preterm AB SAB TAB Ectopic Multiple Living  3 2 2       2     # Outcome Date GA Lbr Len/2nd Weight Sex Delivery Anes PTL Lv  3 CUR           2 TRM 07/01/13 [redacted]w[redacted]d 16:51 / 01:07 7 lb 6.2 oz (3.35 kg) M SVD EPI  Y     Comments: None  1 TRM 2010 [redacted]w[redacted]d 09:00 6 lb 7 oz (2.92 kg) F SVD EPI  Y     Past Medical History  Diagnosis Date  . History of trichomoniasis   . History of chlamydia   . Medical history non-contributory    Past Surgical History  Procedure Laterality Date  . No past surgeries     Family History  Problem Relation Age of Onset  . Asthma Father   . Cancer Paternal Aunt     breast    Exam   System:     General: Well developed & nourished, no acute distress   Skin: Warm & dry, normal coloration and turgor, no rashes   Neurologic: Alert & oriented, normal mood   Cardiovascular: Regular rate & rhythm   Respiratory: Effort & rate normal, LCTAB, acyanotic   Abdomen: Soft, non tender   Extremities: normal strength, tone   Pelvic Exam:    Perineum: Normal perineum   Vulva: Normal, no lesions   Vagina:  Normal mucosa, normal discharge   Cervix: Normal, bulbous, appears closed   Uterus: Normal size/shape/contour for GA   Thin prep pap smear obtained w/ reflex high risk  HPV cotesting FHR: 152 via doppler   Assessment:   Pregnancy: [id number] Patient Active Problem List   Diagnosis Date Noted  . Supervision of other normal pregnancy 01/03/2014    Priority: High  . Short interval between pregnancies complicating pregnancy, antepartum 01/03/2014    Priority: High  . Late prenatal care 01/03/2014    Priority: High  . History of trichomoniasis 12/01/2012  . History of chlamydia 12/01/2012    [redacted]w[redacted]d G3P2002 New OB visit Short interval pregnancy Late prenatal care   Plan:  Initial labs drawn Continue prenatal vitamins Problem list reviewed and updated Reviewed n/v relief measures and warning s/s to report Reviewed recommended weight gain based on pre-gravid BMI Encouraged well-balanced diet Genetic Screening discussed Quad Screen: ordered, drawn today Cystic fibrosis screening discussed neg previous pregnancy Ultrasound discussed; fetal survey: results reviewed Follow up in 3 weeks for visit [place] completed  Tawnya Crook CNM, [facility] 01/03/2014 4:30 PM

## 2014-01-03 NOTE — Patient Instructions (Signed)
Second Trimester of Pregnancy The second trimester is from week 13 through week 28, months 4 through 6. The second trimester is often a time when you feel your best. Your body has also adjusted to being pregnant, and you begin to feel better physically. Usually, morning sickness has lessened or quit completely, you may have more energy, and you may have an increase in appetite. The second trimester is also a time when the fetus is growing rapidly. At the end of the sixth month, the fetus is about 9 inches long and weighs about 1 pounds. You will likely begin to feel the baby move (quickening) between 18 and 20 weeks of the pregnancy. BODY CHANGES Your body goes through many changes during pregnancy. The changes vary from woman to woman.   Your weight will continue to increase. You will notice your lower abdomen bulging out.  You may begin to get stretch marks on your hips, abdomen, and breasts.  You may develop headaches that can be relieved by medicines approved by your caregiver.  You may urinate more often because the fetus is pressing on your bladder.  You may develop or continue to have heartburn as a result of your pregnancy.  You may develop constipation because certain hormones are causing the muscles that push waste through your intestines to slow down.  You may develop hemorrhoids or swollen, bulging veins (varicose veins).  You may have back pain because of the weight gain and pregnancy hormones relaxing your joints between the bones in your pelvis and as a result of a shift in weight and the muscles that support your balance.  Your breasts will continue to grow and be tender.  Your gums may bleed and may be sensitive to brushing and flossing.  Dark spots or blotches (chloasma, mask of pregnancy) may develop on your face. This will likely fade after the baby is born.  A dark line from your belly button to the pubic area (linea nigra) may appear. This will likely fade after the  baby is born. WHAT TO EXPECT AT YOUR PRENATAL VISITS During a routine prenatal visit:  You will be weighed to make sure you and the fetus are growing normally.  Your blood pressure will be taken.  Your abdomen will be measured to track your baby's growth.  The fetal heartbeat will be listened to.  Any test results from the previous visit will be discussed. Your caregiver may ask you:  How you are feeling.  If you are feeling the baby move.  If you have had any abnormal symptoms, such as leaking fluid, bleeding, severe headaches, or abdominal cramping.  If you have any questions. Other tests that may be performed during your second trimester include:  Blood tests that check for:  Low iron levels (anemia).  Gestational diabetes (between 24 and 28 weeks).  Rh antibodies.  Urine tests to check for infections, diabetes, or protein in the urine.  An ultrasound to confirm the proper growth and development of the baby.  An amniocentesis to check for possible genetic problems.  Fetal screens for spina bifida and Down syndrome. HOME CARE INSTRUCTIONS   Avoid all smoking, herbs, alcohol, and unprescribed drugs. These chemicals affect the formation and growth of the baby.  Follow your caregiver's instructions regarding medicine use. There are medicines that are either safe or unsafe to take during pregnancy.  Exercise only as directed by your caregiver. Experiencing uterine cramps is a good sign to stop exercising.  Continue to eat regular,   healthy meals.  Wear a good support bra for breast tenderness.  Do not use hot tubs, steam rooms, or saunas.  Wear your seat belt at all times when driving.  Avoid raw meat, uncooked cheese, cat litter boxes, and soil used by cats. These carry germs that can cause birth defects in the baby.  Take your prenatal vitamins.  Try taking a stool softener (if your caregiver approves) if you develop constipation. Eat more high-fiber foods,  such as fresh vegetables or fruit and whole grains. Drink plenty of fluids to keep your urine clear or pale yellow.  Take warm sitz baths to soothe any pain or discomfort caused by hemorrhoids. Use hemorrhoid cream if your caregiver approves.  If you develop varicose veins, wear support hose. Elevate your feet for 15 minutes, 3 4 times a day. Limit salt in your diet.  Avoid heavy lifting, wear low heel shoes, and practice good posture.  Rest with your legs elevated if you have leg cramps or low back pain.  Visit your dentist if you have not gone yet during your pregnancy. Use a soft toothbrush to brush your teeth and be gentle when you floss.  A sexual relationship may be continued unless your caregiver directs you otherwise.  Continue to go to all your prenatal visits as directed by your caregiver. SEEK MEDICAL CARE IF:   You have dizziness.  You have mild pelvic cramps, pelvic pressure, or nagging pain in the abdominal area.  You have persistent nausea, vomiting, or diarrhea.  You have a bad smelling vaginal discharge.  You have pain with urination. SEEK IMMEDIATE MEDICAL CARE IF:   You have a fever.  You are leaking fluid from your vagina.  You have spotting or bleeding from your vagina.  You have severe abdominal cramping or pain.  You have rapid weight gain or loss.  You have shortness of breath with chest pain.  You notice sudden or extreme swelling of your face, hands, ankles, feet, or legs.  You have not felt your baby move in over an hour.  You have severe headaches that do not go away with medicine.  You have vision changes. Document Released: 09/03/2001 Document Revised: 05/12/2013 Document Reviewed: 11/10/2012 Exit[employer] Patient Information 2014 [employer].  

## 2014-01-04 ENCOUNTER — Other Ambulatory Visit: Payer: Self-pay | Admitting: Women's Health

## 2014-01-04 ENCOUNTER — Telehealth: Payer: Self-pay | Admitting: *Deleted

## 2014-01-04 ENCOUNTER — Encounter: Payer: Self-pay | Admitting: Women's Health

## 2014-01-04 DIAGNOSIS — O99019 Anemia complicating pregnancy, unspecified trimester: Secondary | ICD-10-CM | POA: Insufficient documentation

## 2014-01-04 LAB — OXYCODONE SCREEN, UA, RFLX CONFIRM: Oxycodone Screen, Ur: NEGATIVE ng/mL

## 2014-01-04 LAB — ABO AND RH: RH TYPE: POSITIVE

## 2014-01-04 LAB — URINALYSIS
Bilirubin Urine: NEGATIVE
Glucose, UA: NEGATIVE mg/dL
Hgb urine dipstick: NEGATIVE
Ketones, ur: NEGATIVE mg/dL
Leukocytes, UA: NEGATIVE
NITRITE: NEGATIVE
Protein, ur: NEGATIVE mg/dL
SPECIFIC GRAVITY, URINE: 1.022 (ref 1.005–1.030)
Urobilinogen, UA: 1 mg/dL (ref 0.0–1.0)
pH: 6.5 (ref 5.0–8.0)

## 2014-01-04 LAB — AFP, QUAD SCREEN
AFP: 61.5 [IU]/mL
Age Alone: 1:1140 {titer}
CURR GEST AGE: 17.6 wks.days
Down Syndrome Scr Risk Est: <1:38500 {titer}
HCG, Total: 25051 m[IU]/mL
INH: 115.6 pg/mL
INTERPRETATION-AFP: NEGATIVE
MoM for AFP: 1.36
MoM for INH: 0.63
MoM for hCG: 1.16
Open Spina bifida: NEGATIVE
Tri 18 Scr Risk Est: NEGATIVE
Trisomy 18 (Edward) Syndrome Interp.: 1:157000 {titer}
UE3 MOM: 1.23
UE3 VALUE: 0.9 ng/mL

## 2014-01-04 LAB — DRUG SCREEN, URINE, NO CONFIRMATION
AMPHETAMINE SCRN UR: NEGATIVE
BENZODIAZEPINES.: NEGATIVE
Barbiturate Quant, Ur: NEGATIVE
Cocaine Metabolites: NEGATIVE
Creatinine,U: 243.7 mg/dL
METHADONE: NEGATIVE
Marijuana Metabolite: NEGATIVE
Opiate Screen, Urine: NEGATIVE
Phencyclidine (PCP): NEGATIVE
Propoxyphene: NEGATIVE

## 2014-01-04 LAB — RPR

## 2014-01-04 LAB — VARICELLA ZOSTER ANTIBODY, IGG: Varicella IgG: 311.6 Index — ABNORMAL HIGH (ref <135.00)

## 2014-01-04 LAB — HEPATITIS B SURFACE ANTIGEN: HEP B S AG: NEGATIVE

## 2014-01-04 LAB — CBC
HCT: 32.4 % — ABNORMAL LOW (ref 36.0–46.0)
HEMOGLOBIN: 10.9 g/dL — AB (ref 12.0–15.0)
MCH: 27.6 pg (ref 26.0–34.0)
MCHC: 33.6 g/dL (ref 30.0–36.0)
MCV: 82 fL (ref 78.0–100.0)
Platelets: 276 10*3/uL (ref 150–400)
RBC: 3.95 MIL/uL (ref 3.87–5.11)
RDW: 13.6 % (ref 11.5–15.5)
WBC: 7.2 10*3/uL (ref 4.0–10.5)

## 2014-01-04 LAB — HIV ANTIBODY (ROUTINE TESTING W REFLEX): HIV 1&2 Ab, 4th Generation: NONREACTIVE

## 2014-01-04 LAB — RUBELLA SCREEN: Rubella: 4.09 Index — ABNORMAL HIGH (ref <0.90)

## 2014-01-04 LAB — SICKLE CELL SCREEN: SICKLE CELL SCREEN: NEGATIVE

## 2014-01-04 LAB — ANTIBODY SCREEN: Antibody Screen: NEGATIVE

## 2014-01-04 LAB — URINE CULTURE
COLONY COUNT: NO GROWTH
ORGANISM ID, BACTERIA: NO GROWTH

## 2014-01-04 MED ORDER — FERROUS SULFATE 325 (65 FE) MG PO TABS: 325.0000 mg | ORAL_TABLET | Freq: Two times a day (BID) | ORAL | Status: DC

## 2014-01-04 NOTE — Telephone Encounter (Signed)
Spoke with pt letting her know she is anemic. Rx has been sent to pharmacy. Advised it's best taken with OJ and advised to increase iron rich foods like red meat, green leafy veg, and beans. Pt voiced understanding. [place]

## 2014-01-05 ENCOUNTER — Encounter: Payer: Self-pay | Admitting: Women's Health

## 2014-01-24 ENCOUNTER — Encounter: Payer: Self-pay | Admitting: Adult Health

## 2014-01-24 ENCOUNTER — Ambulatory Visit (INDEPENDENT_AMBULATORY_CARE_PROVIDER_SITE_OTHER): Payer: Medicaid Other | Admitting: Adult Health

## 2014-01-24 VITALS — BP 110/60 | Wt 135.0 lb

## 2014-01-24 DIAGNOSIS — O99019 Anemia complicating pregnancy, unspecified trimester: Secondary | ICD-10-CM

## 2014-01-24 DIAGNOSIS — O0930 Supervision of pregnancy with insufficient antenatal care, unspecified trimester: Secondary | ICD-10-CM

## 2014-01-24 DIAGNOSIS — O99891 Other specified diseases and conditions complicating pregnancy: Secondary | ICD-10-CM

## 2014-01-24 DIAGNOSIS — O9989 Other specified diseases and conditions complicating pregnancy, childbirth and the puerperium: Secondary | ICD-10-CM

## 2014-01-24 DIAGNOSIS — Z331 Pregnant state, incidental: Secondary | ICD-10-CM

## 2014-01-24 DIAGNOSIS — Z1389 Encounter for screening for other disorder: Secondary | ICD-10-CM

## 2014-01-24 LAB — POCT URINALYSIS DIPSTICK
GLUCOSE UA: NEGATIVE
Ketones, UA: NEGATIVE
Nitrite, UA: NEGATIVE
Protein, UA: NEGATIVE
RBC UA: NEGATIVE

## 2014-01-24 NOTE — Patient Instructions (Signed)
Second Trimester of Pregnancy The second trimester is from week 13 through week 28, months 4 through 6. The second trimester is often a time when you feel your best. Your body has also adjusted to being pregnant, and you begin to feel better physically. Usually, morning sickness has lessened or quit completely, you may have more energy, and you may have an increase in appetite. The second trimester is also a time when the fetus is growing rapidly. At the end of the sixth month, the fetus is about 9 inches long and weighs about 1 pounds. You will likely begin to feel the baby move (quickening) between 18 and 20 weeks of the pregnancy. BODY CHANGES Your body goes through many changes during pregnancy. The changes vary from woman to woman.   Your weight will continue to increase. You will notice your lower abdomen bulging out.  You may begin to get stretch marks on your hips, abdomen, and breasts.  You may develop headaches that can be relieved by medicines approved by your caregiver.  You may urinate more often because the fetus is pressing on your bladder.  You may develop or continue to have heartburn as a result of your pregnancy.  You may develop constipation because certain hormones are causing the muscles that push waste through your intestines to slow down.  You may develop hemorrhoids or swollen, bulging veins (varicose veins).  You may have back pain because of the weight gain and pregnancy hormones relaxing your joints between the bones in your pelvis and as a result of a shift in weight and the muscles that support your balance.  Your breasts will continue to grow and be tender.  Your gums may bleed and may be sensitive to brushing and flossing.  Dark spots or blotches (chloasma, mask of pregnancy) may develop on your face. This will likely fade after the baby is born.  A dark line from your belly button to the pubic area (linea nigra) may appear. This will likely fade after the  baby is born. WHAT TO EXPECT AT YOUR PRENATAL VISITS During a routine prenatal visit:  You will be weighed to make sure you and the fetus are growing normally.  Your blood pressure will be taken.  Your abdomen will be measured to track your baby's growth.  The fetal heartbeat will be listened to.  Any test results from the previous visit will be discussed. Your caregiver may ask you:  How you are feeling.  If you are feeling the baby move.  If you have had any abnormal symptoms, such as leaking fluid, bleeding, severe headaches, or abdominal cramping.  If you have any questions. Other tests that may be performed during your second trimester include:  Blood tests that check for:  Low iron levels (anemia).  Gestational diabetes (between 24 and 28 weeks).  Rh antibodies.  Urine tests to check for infections, diabetes, or protein in the urine.  An ultrasound to confirm the proper growth and development of the baby.  An amniocentesis to check for possible genetic problems.  Fetal screens for spina bifida and Down syndrome. HOME CARE INSTRUCTIONS   Avoid all smoking, herbs, alcohol, and unprescribed drugs. These chemicals affect the formation and growth of the baby.  Follow your caregiver's instructions regarding medicine use. There are medicines that are either safe or unsafe to take during pregnancy.  Exercise only as directed by your caregiver. Experiencing uterine cramps is a good sign to stop exercising.  Continue to eat regular,   healthy meals.  Wear a good support bra for breast tenderness.  Do not use hot tubs, steam rooms, or saunas.  Wear your seat belt at all times when driving.  Avoid raw meat, uncooked cheese, cat litter boxes, and soil used by cats. These carry germs that can cause birth defects in the baby.  Take your prenatal vitamins.  Try taking a stool softener (if your caregiver approves) if you develop constipation. Eat more high-fiber foods,  such as fresh vegetables or fruit and whole grains. Drink plenty of fluids to keep your urine clear or pale yellow.  Take warm sitz baths to soothe any pain or discomfort caused by hemorrhoids. Use hemorrhoid cream if your caregiver approves.  If you develop varicose veins, wear support hose. Elevate your feet for 15 minutes, 3 4 times a day. Limit salt in your diet.  Avoid heavy lifting, wear low heel shoes, and practice good posture.  Rest with your legs elevated if you have leg cramps or low back pain.  Visit your dentist if you have not gone yet during your pregnancy. Use a soft toothbrush to brush your teeth and be gentle when you floss.  A sexual relationship may be continued unless your caregiver directs you otherwise.  Continue to go to all your prenatal visits as directed by your caregiver. SEEK MEDICAL CARE IF:   You have dizziness.  You have mild pelvic cramps, pelvic pressure, or nagging pain in the abdominal area.  You have persistent nausea, vomiting, or diarrhea.  You have a bad smelling vaginal discharge.  You have pain with urination. SEEK IMMEDIATE MEDICAL CARE IF:   You have a fever.  You are leaking fluid from your vagina.  You have spotting or bleeding from your vagina.  You have severe abdominal cramping or pain.  You have rapid weight gain or loss.  You have shortness of breath with chest pain.  You notice sudden or extreme swelling of your face, hands, ankles, feet, or legs.  You have not felt your baby move in over an hour.  You have severe headaches that do not go away with medicine.  You have vision changes. Document Released: 09/03/2001 Document Revised: 05/12/2013 Document Reviewed: 11/10/2012 [facility] Patient Information 2014 [place]. Follow up in  4 weeks

## 2014-01-24 NOTE — Progress Notes (Signed)
Pt denies any problems or concerns at this time. Pt states that she worries a little because she hasn't felt the baby move yet.

## 2014-01-24 NOTE — Progress Notes (Signed)
Pt denies any bleeding or pain ,has not felt baby movement yet.No vaginal discharge or UTI symptoms.follow up in 4 weeks.FHR 158 today by doppler.wants depo after delivery.

## 2014-02-21 ENCOUNTER — Encounter: Payer: Self-pay | Admitting: Women's Health

## 2014-02-21 ENCOUNTER — Ambulatory Visit (INDEPENDENT_AMBULATORY_CARE_PROVIDER_SITE_OTHER): Payer: Medicaid Other | Admitting: Women's Health

## 2014-02-21 VITALS — BP 102/60 | Wt 141.0 lb

## 2014-02-21 DIAGNOSIS — Z3480 Encounter for supervision of other normal pregnancy, unspecified trimester: Secondary | ICD-10-CM

## 2014-02-21 DIAGNOSIS — Z1389 Encounter for screening for other disorder: Secondary | ICD-10-CM

## 2014-02-21 DIAGNOSIS — Z331 Pregnant state, incidental: Secondary | ICD-10-CM

## 2014-02-21 LAB — POCT URINALYSIS DIPSTICK
Blood, UA: NEGATIVE
GLUCOSE UA: NEGATIVE
KETONES UA: NEGATIVE
Nitrite, UA: NEGATIVE
Protein, UA: NEGATIVE

## 2014-02-21 NOTE — Progress Notes (Signed)
Reports good fm. Denies uc's, lof, vb, uti s/s.  No complaints.  Reviewed ptl s/s, fm.  All questions answered. F/U in 4wks for pn2 & visit.

## 2014-02-21 NOTE — Patient Instructions (Signed)
You will have your sugar test next visit.  Please do not eat or drink anything after midnight the night before you come, not even water.  You will be here for at least two hours.     Second Trimester of Pregnancy The second trimester is from week 13 through week 28, months 4 through 6. The second trimester is often a time when you feel your best. Your body has also adjusted to being pregnant, and you begin to feel better physically. Usually, morning sickness has lessened or quit completely, you may have more energy, and you may have an increase in appetite. The second trimester is also a time when the fetus is growing rapidly. At the end of the sixth month, the fetus is about 9 inches long and weighs about 1 pounds. You will likely begin to feel the baby move (quickening) between 18 and 20 weeks of the pregnancy. BODY CHANGES Your body goes through many changes during pregnancy. The changes vary from woman to woman.   Your weight will continue to increase. You will notice your lower abdomen bulging out.  You may begin to get stretch marks on your hips, abdomen, and breasts.  You may develop headaches that can be relieved by medicines approved by your caregiver.  You may urinate more often because the fetus is pressing on your bladder.  You may develop or continue to have heartburn as a result of your pregnancy.  You may develop constipation because certain hormones are causing the muscles that push waste through your intestines to slow down.  You may develop hemorrhoids or swollen, bulging veins (varicose veins).  You may have back pain because of the weight gain and pregnancy hormones relaxing your joints between the bones in your pelvis and as a result of a shift in weight and the muscles that support your balance.  Your breasts will continue to grow and be tender.  Your gums may bleed and may be sensitive to brushing and flossing.  Dark spots or blotches (chloasma, mask of pregnancy)  may develop on your face. This will likely fade after the baby is born.  A dark line from your belly button to the pubic area (linea nigra) may appear. This will likely fade after the baby is born. WHAT TO EXPECT AT YOUR PRENATAL VISITS During a routine prenatal visit:  You will be weighed to make sure you and the fetus are growing normally.  Your blood pressure will be taken.  Your abdomen will be measured to track your baby's growth.  The fetal heartbeat will be listened to.  Any test results from the previous visit will be discussed. Your caregiver may ask you:  How you are feeling.  If you are feeling the baby move.  If you have had any abnormal symptoms, such as leaking fluid, bleeding, severe headaches, or abdominal cramping.  If you have any questions. Other tests that may be performed during your second trimester include:  Blood tests that check for:  Low iron levels (anemia).  Gestational diabetes (between 24 and 28 weeks).  Rh antibodies.  Urine tests to check for infections, diabetes, or protein in the urine.  An ultrasound to confirm the proper growth and development of the baby.  An amniocentesis to check for possible genetic problems.  Fetal screens for spina bifida and Down syndrome. HOME CARE INSTRUCTIONS   Avoid all smoking, herbs, alcohol, and unprescribed drugs. These chemicals affect the formation and growth of the baby.  Follow your caregiver's  instructions regarding medicine use. There are medicines that are either safe or unsafe to take during pregnancy.  Exercise only as directed by your caregiver. Experiencing uterine cramps is a good sign to stop exercising.  Continue to eat regular, healthy meals.  Wear a good support bra for breast tenderness.  Do not use hot tubs, steam rooms, or saunas.  Wear your seat belt at all times when driving.  Avoid raw meat, uncooked cheese, cat litter boxes, and soil used by cats. These carry germs that  can cause birth defects in the baby.  Take your prenatal vitamins.  Try taking a stool softener (if your caregiver approves) if you develop constipation. Eat more high-fiber foods, such as fresh vegetables or fruit and whole grains. Drink plenty of fluids to keep your urine clear or pale yellow.  Take warm sitz baths to soothe any pain or discomfort caused by hemorrhoids. Use hemorrhoid cream if your caregiver approves.  If you develop varicose veins, wear support hose. Elevate your feet for 15 minutes, 3 4 times a day. Limit salt in your diet.  Avoid heavy lifting, wear low heel shoes, and practice good posture.  Rest with your legs elevated if you have leg cramps or low back pain.  Visit your dentist if you have not gone yet during your pregnancy. Use a soft toothbrush to brush your teeth and be gentle when you floss.  A sexual relationship may be continued unless your caregiver directs you otherwise.  Continue to go to all your prenatal visits as directed by your caregiver. SEEK MEDICAL CARE IF:   You have dizziness.  You have mild pelvic cramps, pelvic pressure, or nagging pain in the abdominal area.  You have persistent nausea, vomiting, or diarrhea.  You have a bad smelling vaginal discharge.  You have pain with urination. SEEK IMMEDIATE MEDICAL CARE IF:   You have a fever.  You are leaking fluid from your vagina.  You have spotting or bleeding from your vagina.  You have severe abdominal cramping or pain.  You have rapid weight gain or loss.  You have shortness of breath with chest pain.  You notice sudden or extreme swelling of your face, hands, ankles, feet, or legs.  You have not felt your baby move in over an hour.  You have severe headaches that do not go away with medicine.  You have vision changes. Document Released: 09/03/2001 Document Revised: 05/12/2013 Document Reviewed: 11/10/2012 [facility] Patient Information 2014 [place].

## 2014-03-21 ENCOUNTER — Encounter: Payer: Medicaid Other | Admitting: Obstetrics & Gynecology

## 2014-03-31 ENCOUNTER — Encounter: Payer: Medicaid Other | Admitting: Obstetrics & Gynecology

## 2014-04-07 ENCOUNTER — Other Ambulatory Visit: Payer: Medicaid Other

## 2014-04-07 ENCOUNTER — Encounter: Payer: Self-pay | Admitting: Obstetrics & Gynecology

## 2014-04-07 ENCOUNTER — Ambulatory Visit (INDEPENDENT_AMBULATORY_CARE_PROVIDER_SITE_OTHER): Payer: Medicaid Other | Admitting: Obstetrics & Gynecology

## 2014-04-07 VITALS — BP 96/50 | Wt 144.0 lb

## 2014-04-07 DIAGNOSIS — Z331 Pregnant state, incidental: Secondary | ICD-10-CM

## 2014-04-07 DIAGNOSIS — Z3483 Encounter for supervision of other normal pregnancy, third trimester: Secondary | ICD-10-CM

## 2014-04-07 DIAGNOSIS — Z3480 Encounter for supervision of other normal pregnancy, unspecified trimester: Secondary | ICD-10-CM

## 2014-04-07 DIAGNOSIS — Z1389 Encounter for screening for other disorder: Secondary | ICD-10-CM

## 2014-04-07 LAB — POCT URINALYSIS DIPSTICK
Glucose, UA: NEGATIVE
Ketones, UA: NEGATIVE
Leukocytes, UA: NEGATIVE
NITRITE UA: NEGATIVE
Protein, UA: NEGATIVE
RBC UA: NEGATIVE

## 2014-04-07 LAB — CBC
HEMATOCRIT: 31 % — AB (ref 36.0–46.0)
Hemoglobin: 10.8 g/dL — ABNORMAL LOW (ref 12.0–15.0)
MCH: 28.8 pg (ref 26.0–34.0)
MCHC: 34.8 g/dL (ref 30.0–36.0)
MCV: 82.7 fL (ref 78.0–100.0)
PLATELETS: 213 10*3/uL (ref 150–400)
RBC: 3.75 MIL/uL — AB (ref 3.87–5.11)
RDW: 12.9 % (ref 11.5–15.5)
WBC: 6.2 10*3/uL (ref 4.0–10.5)

## 2014-04-07 NOTE — Progress Notes (Signed)
Has missed appointments no good reason PN2 today  G3P2002 [redacted]w[redacted]d Estimated Date of Delivery: 06/07/14  Blood pressure 96/50, weight 144 lb (65.318 kg), last menstrual period 08/31/2013, not currently breastfeeding.   BP weight and urine results all reviewed and noted.  Please refer to the obstetrical flow sheet for the fundal height and fetal heart rate documentation:  Patient reports good fetal movement, denies any bleeding and no rupture of membranes symptoms or regular contractions. Patient is without complaints. All questions were answered.  Plan:  Continued routine obstetrical care,   Follow up in 2 weeks for OB appointment, routine

## 2014-04-08 LAB — GLUCOSE TOLERANCE, 2 HOURS W/ 1HR
GLUCOSE, FASTING: 74 mg/dL (ref 70–99)
GLUCOSE: 100 mg/dL (ref 70–170)
Glucose, 2 hour: 81 mg/dL (ref 70–139)

## 2014-04-08 LAB — RPR

## 2014-04-08 LAB — ANTIBODY SCREEN: ANTIBODY SCREEN: NEGATIVE

## 2014-04-08 LAB — HSV 2 ANTIBODY, IGG

## 2014-04-08 LAB — HIV ANTIBODY (ROUTINE TESTING W REFLEX): HIV 1&2 Ab, 4th Generation: NONREACTIVE

## 2014-04-21 ENCOUNTER — Encounter: Payer: Medicaid Other | Admitting: Advanced Practice Midwife

## 2014-04-27 ENCOUNTER — Encounter: Payer: Medicaid Other | Admitting: Advanced Practice Midwife

## 2014-04-28 ENCOUNTER — Ambulatory Visit (INDEPENDENT_AMBULATORY_CARE_PROVIDER_SITE_OTHER): Payer: Medicaid Other | Admitting: Advanced Practice Midwife

## 2014-04-28 VITALS — BP 102/50 | Wt 150.0 lb

## 2014-04-28 DIAGNOSIS — O2390 Unspecified genitourinary tract infection in pregnancy, unspecified trimester: Secondary | ICD-10-CM

## 2014-04-28 DIAGNOSIS — Z1389 Encounter for screening for other disorder: Secondary | ICD-10-CM

## 2014-04-28 DIAGNOSIS — Z3480 Encounter for supervision of other normal pregnancy, unspecified trimester: Secondary | ICD-10-CM

## 2014-04-28 DIAGNOSIS — N390 Urinary tract infection, site not specified: Secondary | ICD-10-CM

## 2014-04-28 DIAGNOSIS — Z3483 Encounter for supervision of other normal pregnancy, third trimester: Secondary | ICD-10-CM

## 2014-04-28 DIAGNOSIS — Z331 Pregnant state, incidental: Secondary | ICD-10-CM

## 2014-04-28 LAB — POCT URINALYSIS DIPSTICK
Glucose, UA: NEGATIVE
Ketones, UA: NEGATIVE
Leukocytes, UA: NEGATIVE
Nitrite, UA: NEGATIVE
Protein, UA: 1
RBC UA: NEGATIVE

## 2014-04-28 NOTE — Progress Notes (Signed)
[id number] [redacted]w[redacted]d Estimated Date of Delivery: 06/07/14  Last menstrual period 08/31/2013, not currently breastfeeding.   BP weight and urine results all reviewed and noted. +1 protein, no sx. Will culture  Please refer to the obstetrical flow sheet for the fundal height and fetal heart rate documentation:  Patient reports good fetal movement, denies any bleeding and no rupture of membranes symptoms or regular contractions. Patient is without complaints. All questions were answered.  Plan:  Continued routine obstetrical care,   Follow up in 2 weeks for OB appointment,

## 2014-04-29 LAB — URINE CULTURE: Colony Count: 70000

## 2014-05-12 ENCOUNTER — Encounter: Payer: Self-pay | Admitting: Advanced Practice Midwife

## 2014-05-12 ENCOUNTER — Ambulatory Visit (INDEPENDENT_AMBULATORY_CARE_PROVIDER_SITE_OTHER): Payer: Medicaid Other | Admitting: Advanced Practice Midwife

## 2014-05-12 VITALS — BP 118/66 | Wt 154.0 lb

## 2014-05-12 DIAGNOSIS — Z3480 Encounter for supervision of other normal pregnancy, unspecified trimester: Secondary | ICD-10-CM

## 2014-05-12 DIAGNOSIS — Z331 Pregnant state, incidental: Secondary | ICD-10-CM

## 2014-05-12 DIAGNOSIS — Z3483 Encounter for supervision of other normal pregnancy, third trimester: Secondary | ICD-10-CM

## 2014-05-12 DIAGNOSIS — Z1389 Encounter for screening for other disorder: Secondary | ICD-10-CM

## 2014-05-12 MED ORDER — MEDROXYPROGESTERONE ACETATE 150 MG/ML IM SUSP: 150.0000 mg | INTRAMUSCULAR | Status: DC

## 2014-05-12 NOTE — Progress Notes (Signed)
[id number] [redacted]w[redacted]d Estimated Date of Delivery: 06/07/14  Last menstrual period 08/31/2013, not currently breastfeeding.   BP weight and urine results all reviewed and noted.  Please refer to the obstetrical flow sheet for the fundal height and fetal heart rate documentation: urine culture last visit show mixed colonies.  Patient reports good fetal movement, denies any bleeding and no rupture of membranes symptoms or regular contractions. Patient is without complaints. All questions were answered.  Plan:  Continued routine obstetrical care, Depo sent to pharmacy for postpartum use  Follow up in 1 weeks for OB appointment, GBS

## 2014-05-12 NOTE — Progress Notes (Signed)
Pt denies any problems oro concerns at this time.

## 2014-05-19 ENCOUNTER — Encounter: Payer: Medicaid Other | Admitting: Obstetrics & Gynecology

## 2014-05-23 ENCOUNTER — Encounter: Payer: Self-pay | Admitting: Obstetrics & Gynecology

## 2014-05-23 ENCOUNTER — Ambulatory Visit (INDEPENDENT_AMBULATORY_CARE_PROVIDER_SITE_OTHER): Payer: Medicaid Other | Admitting: Obstetrics & Gynecology

## 2014-05-23 VITALS — BP 110/70 | Wt 153.0 lb

## 2014-05-23 DIAGNOSIS — Z3483 Encounter for supervision of other normal pregnancy, third trimester: Secondary | ICD-10-CM

## 2014-05-23 DIAGNOSIS — Z3685 Encounter for antenatal screening for Streptococcus B: Secondary | ICD-10-CM

## 2014-05-23 DIAGNOSIS — Z1389 Encounter for screening for other disorder: Secondary | ICD-10-CM

## 2014-05-23 DIAGNOSIS — Z1159 Encounter for screening for other viral diseases: Secondary | ICD-10-CM

## 2014-05-23 DIAGNOSIS — Z331 Pregnant state, incidental: Secondary | ICD-10-CM

## 2014-05-23 DIAGNOSIS — Z3480 Encounter for supervision of other normal pregnancy, unspecified trimester: Secondary | ICD-10-CM

## 2014-05-23 LAB — POCT URINALYSIS DIPSTICK
Blood, UA: NEGATIVE
Glucose, UA: NEGATIVE
KETONES UA: NEGATIVE
NITRITE UA: NEGATIVE

## 2014-05-23 LAB — OB RESULTS CONSOLE GC/CHLAMYDIA
Chlamydia: NEGATIVE
Gonorrhea: NEGATIVE

## 2014-05-23 NOTE — Addendum Note (Signed)
Addended by: Doyne Keel on: 05/23/2014 03:46 PM   Modules accepted: Orders

## 2014-05-23 NOTE — Progress Notes (Signed)
[id number] [redacted]w[redacted]d Estimated Date of Delivery: 06/07/14  Blood pressure 110/70, weight 153 lb (69.4 kg), last menstrual period 08/31/2013, not currently breastfeeding.   BP weight and urine results all reviewed and noted.  Please refer to the obstetrical flow sheet for the fundal height and fetal heart rate documentation:  Patient reports good fetal movement, denies any bleeding and no rupture of membranes symptoms or regular contractions. Patient is without complaints. All questions were answered.  Plan:  Continued routine obstetrical care,   Follow up in 1 weeks for OB appointment, routine

## 2014-05-24 LAB — GC/CHLAMYDIA PROBE AMP
CT Probe RNA: NEGATIVE
GC Probe RNA: NEGATIVE

## 2014-05-25 ENCOUNTER — Encounter: Payer: Self-pay | Admitting: Obstetrics & Gynecology

## 2014-05-25 LAB — STREP B DNA PROBE: STREP GROUP B AG: NOT DETECTED

## 2014-05-29 ENCOUNTER — Inpatient Hospital Stay (HOSPITAL_COMMUNITY)
Admission: AD | Admit: 2014-05-29 | Discharge: 2014-05-30 | DRG: 775 | Disposition: A | Discharge status: Home / Self Care | Payer: Medicaid Other | Source: Ambulatory Visit | Attending: Obstetrics & Gynecology | Admitting: Obstetrics & Gynecology

## 2014-05-29 ENCOUNTER — Encounter (HOSPITAL_COMMUNITY): Payer: Self-pay | Admitting: *Deleted

## 2014-05-29 DIAGNOSIS — Z3483 Encounter for supervision of other normal pregnancy, third trimester: Secondary | ICD-10-CM

## 2014-05-29 DIAGNOSIS — O479 False labor, unspecified: Secondary | ICD-10-CM | POA: Diagnosis present

## 2014-05-29 DIAGNOSIS — O99019 Anemia complicating pregnancy, unspecified trimester: Secondary | ICD-10-CM

## 2014-05-29 DIAGNOSIS — D649 Anemia, unspecified: Secondary | ICD-10-CM | POA: Diagnosis present

## 2014-05-29 DIAGNOSIS — IMO0001 Reserved for inherently not codable concepts without codable children: Secondary | ICD-10-CM

## 2014-05-29 DIAGNOSIS — O9902 Anemia complicating childbirth: Secondary | ICD-10-CM | POA: Diagnosis present

## 2014-05-29 LAB — CBC
HCT: 31.9 % — ABNORMAL LOW (ref 36.0–46.0)
Hemoglobin: 10.8 g/dL — ABNORMAL LOW (ref 12.0–15.0)
MCH: 29.7 pg (ref 26.0–34.0)
MCHC: 33.9 g/dL (ref 30.0–36.0)
MCV: 87.6 fL (ref 78.0–100.0)
PLATELETS: 194 10*3/uL (ref 150–400)
RBC: 3.64 MIL/uL — ABNORMAL LOW (ref 3.87–5.11)
RDW: 13 % (ref 11.5–15.5)
WBC: 14.3 10*3/uL — AB (ref 4.0–10.5)

## 2014-05-29 LAB — ABO/RH: ABO/RH(D): A POS

## 2014-05-29 LAB — TYPE AND SCREEN
ABO/RH(D): A POS
Antibody Screen: NEGATIVE

## 2014-05-29 LAB — RPR

## 2014-05-29 MED ORDER — OXYTOCIN 40 UNITS IN LACTATED RINGERS INFUSION - SIMPLE MED
62.5000 mL/h | INTRAVENOUS | Status: DC
  Administered 2014-05-29: 62.5 mL/h via INTRAVENOUS

## 2014-05-29 MED ORDER — WITCH HAZEL-GLYCERIN EX PADS: 1.0000 "application " | MEDICATED_PAD | CUTANEOUS | Status: DC | PRN

## 2014-05-29 MED ORDER — FLEET ENEMA 7-19 GM/118ML RE ENEM: 1.0000 | ENEMA | RECTAL | Status: DC | PRN

## 2014-05-29 MED ORDER — LACTATED RINGERS IV SOLN
INTRAVENOUS | Status: DC
Start: 2014-05-29 — End: 2014-05-29

## 2014-05-29 MED ORDER — ONDANSETRON HCL 4 MG PO TABS: 4.0000 mg | ORAL_TABLET | ORAL | Status: DC | PRN

## 2014-05-29 MED ORDER — DIPHENHYDRAMINE HCL 25 MG PO CAPS: 25.0000 mg | ORAL_CAPSULE | Freq: Four times a day (QID) | ORAL | Status: DC | PRN

## 2014-05-29 MED ORDER — LACTATED RINGERS IV SOLN: 500.0000 mL | INTRAVENOUS | Status: DC | PRN

## 2014-05-29 MED ORDER — OXYCODONE-ACETAMINOPHEN 5-325 MG PO TABS: 1.0000 | ORAL_TABLET | ORAL | Status: DC | PRN

## 2014-05-29 MED ORDER — ONDANSETRON HCL 4 MG/2ML IJ SOLN: 4.0000 mg | Freq: Four times a day (QID) | INTRAMUSCULAR | Status: DC | PRN

## 2014-05-29 MED ORDER — SIMETHICONE 80 MG PO CHEW: 80.0000 mg | CHEWABLE_TABLET | ORAL | Status: DC | PRN

## 2014-05-29 MED ORDER — PRENATAL MULTIVITAMIN CH
1.0000 | ORAL_TABLET | Freq: Every day | ORAL | Status: DC
  Administered 2014-05-29 – 2014-05-30 (×2): 1 via ORAL
  Filled 2014-05-29 (×2): qty 1

## 2014-05-29 MED ORDER — CITRIC ACID-SODIUM CITRATE 334-500 MG/5ML PO SOLN: 30.0000 mL | ORAL | Status: DC | PRN

## 2014-05-29 MED ORDER — SENNOSIDES-DOCUSATE SODIUM 8.6-50 MG PO TABS
2.0000 | ORAL_TABLET | ORAL | Status: DC
  Administered 2014-05-29: 2 via ORAL
  Filled 2014-05-29: qty 2

## 2014-05-29 MED ORDER — HYDROXYZINE HCL 50 MG PO TABS
50.0000 mg | ORAL_TABLET | Freq: Four times a day (QID) | ORAL | Status: DC | PRN
  Filled 2014-05-29: qty 1

## 2014-05-29 MED ORDER — OXYCODONE-ACETAMINOPHEN 5-325 MG PO TABS
1.0000 | ORAL_TABLET | ORAL | Status: DC | PRN
  Administered 2014-05-29: 1 via ORAL
  Filled 2014-05-29: qty 1

## 2014-05-29 MED ORDER — OXYCODONE-ACETAMINOPHEN 5-325 MG PO TABS: 2.0000 | ORAL_TABLET | ORAL | Status: DC | PRN

## 2014-05-29 MED ORDER — ACETAMINOPHEN 325 MG PO TABS
650.0000 mg | ORAL_TABLET | ORAL | Status: DC | PRN
Start: 2014-05-29 — End: 2014-05-29

## 2014-05-29 MED ORDER — IBUPROFEN 600 MG PO TABS
600.0000 mg | ORAL_TABLET | Freq: Four times a day (QID) | ORAL | Status: DC
  Administered 2014-05-29 – 2014-05-30 (×5): 600 mg via ORAL
  Filled 2014-05-29 (×5): qty 1

## 2014-05-29 MED ORDER — OXYTOCIN BOLUS FROM INFUSION
500.0000 mL | INTRAVENOUS | Status: DC
  Administered 2014-05-29: 500 mL via INTRAVENOUS

## 2014-05-29 MED ORDER — TETANUS-DIPHTH-ACELL PERTUSSIS 5-2.5-18.5 LF-MCG/0.5 IM SUSP: 0.5000 mL | Freq: Once | INTRAMUSCULAR | Status: DC

## 2014-05-29 MED ORDER — LIDOCAINE HCL (PF) 1 % IJ SOLN
30.0000 mL | INTRAMUSCULAR | Status: DC | PRN
  Filled 2014-05-29: qty 30

## 2014-05-29 MED ORDER — DIBUCAINE 1 % RE OINT: 1.0000 "application " | TOPICAL_OINTMENT | RECTAL | Status: DC | PRN

## 2014-05-29 MED ORDER — OXYTOCIN 40 UNITS IN LACTATED RINGERS INFUSION - SIMPLE MED
INTRAVENOUS | Status: AC
  Filled 2014-05-29: qty 1000

## 2014-05-29 MED ORDER — LANOLIN HYDROUS EX OINT: TOPICAL_OINTMENT | CUTANEOUS | Status: DC | PRN

## 2014-05-29 MED ORDER — FENTANYL CITRATE 0.05 MG/ML IJ SOLN: 100.0000 ug | INTRAMUSCULAR | Status: DC | PRN

## 2014-05-29 MED ORDER — ZOLPIDEM TARTRATE 5 MG PO TABS: 5.0000 mg | ORAL_TABLET | Freq: Every evening | ORAL | Status: DC | PRN

## 2014-05-29 MED ORDER — ONDANSETRON HCL 4 MG/2ML IJ SOLN: 4.0000 mg | INTRAMUSCULAR | Status: DC | PRN

## 2014-05-29 MED ORDER — BENZOCAINE-MENTHOL 20-0.5 % EX AERO
1.0000 "application " | INHALATION_SPRAY | CUTANEOUS | Status: DC | PRN
  Filled 2014-05-29: qty 56

## 2014-05-29 NOTE — MAU Note (Signed)
Pt to BS via stretcher with Reina Fuse  CNM at bedside with RN during transport.

## 2014-05-29 NOTE — H&P (Signed)
Chelsea Strong is a 23 y.o. G30P2002 female at [redacted]w[redacted]d by LMP c/w 16wk u/s, presenting w/ uc's x 2hrs.   Reports active fetal movement, contractions: regular, vaginal bleeding: none, membranes: intact. Initiated prenatal care at [facility] at 17.6 wks.   Pregnancy complicated by late care @ 17wks, short interval b/w pregnancies- last birth Oct 2014, anemia.  H/O term uncomplicated SVD x 2.   Past Medical History: Past Medical History  Diagnosis Date  . History of trichomoniasis   . History of chlamydia   . Medical history non-contributory     Past Surgical History: Past Surgical History  Procedure Laterality Date  . No past surgeries      Obstetrical History: OB History   Grav Para Term Preterm Abortions TAB SAB Ect Mult Living   3 2 2       2       Social History: History   Social History  . Marital Status: Single    Spouse Name: N/A    Number of Children: N/A  . Years of Education: N/A   Social History Main Topics  . Smoking status: Never Smoker   . Smokeless tobacco: Never Used  . Alcohol Use: No  . Drug Use: No  . Sexual Activity: Yes    Birth Control/ Protection: None   Other Topics Concern  . Not on file   Social History Narrative  . No narrative on file    Family History: Family History  Problem Relation Age of Onset  . Asthma Father   . Cancer Paternal Aunt     breast    Allergies: No Known Allergies  Prescriptions prior to admission  Medication Sig Dispense Refill  . Prenatal Vit-Fe Fumarate-FA (PRENATAL MULTIVITAMIN) TABS tablet Take 1 tablet by mouth daily at 12 noon.      . ferrous sulfate 325 (65 FE) MG tablet Take 1 tablet (325 mg total) by mouth 2 (two) times daily with a meal.  60 tablet  3  . medroxyPROGESTERone (DEPO-PROVERA) 150 MG/ML injection Inject 1 mL (150 mg total) into the muscle every 3 (three) months.  1 mL  3     Review of Systems  Pertinent pos/neg as indicated in HPI    Blood pressure 134/90, pulse 80, temperature 98.6  F (37 C), temperature source Oral, resp. rate 20, height 5\' 1"  (1.549 m), weight 69.4 kg (153 lb), last menstrual period 08/31/2013, not currently breastfeeding. General appearance: alert, cooperative and no distress Lungs: clear to auscultation bilaterally Heart: regular rate and rhythm Abdomen: gravid, soft, non-tender Extremities: Trace edema  Fetal monitoring: FHR: 130 bpm, variability: moderate,  Accelerations: Present,  decelerations:  Absent Uterine activity: 4-5  Dilation: 5 Effacement (%): 90 Station:  (bbow) Exam by:: K henson, RNC Presentation: cephalic   Prenatal labs: ABO, Rh: A/POS/-- (04/13 1638) Antibody: NEG (07/16 0918) Rubella:   RPR: NON REAC (07/16 0918)  HBsAg: NEGATIVE (04/13 1638)  HIV: NONREACTIVE (07/16 0918)  GBS: NOT DETECTED (08/31 1559)   2 hr Glucola: 74/100/81 Genetic screening:  Neg Quad Anatomy US: normal female  No results found for this or any previous visit (from the past 24 hour(s)).   Assessment:  [redacted]w[redacted]d SIUP  G3P2002  Active labor  Cat 1 FHR  GBS NOT DETECTED (08/31 1559)  Plan:  Admit to BS  IV pain meds/epidural prn active labor  Expectant management  Anticipate NSVD   Plans to breastfeed  Contraception: depo  Circumcision: n/a  Tawnya Crook CNM, WHNP-BC 05/29/2014,  5:06 AM

## 2014-05-29 NOTE — MAU Note (Signed)
Hinton Dyer RN in [facility] called for room for pt. Will call back to MAU

## 2014-05-29 NOTE — MAU Note (Signed)
Ctx x 2 hours, denied LOF, VB. +FM

## 2014-05-29 NOTE — H&P (Signed)
Attestation of Attending Supervision of Advanced Practitioner (PA/CNM/NP): Evaluation and management procedures were performed by the Advanced Practitioner under my supervision and collaboration.  I have reviewed the Advanced Practitioner's note and chart, and I agree with the management and plan.  Donnamae Jude, MD [facility] Attending 05/29/2014 7:00 AM

## 2014-05-30 ENCOUNTER — Encounter (HOSPITAL_COMMUNITY): Payer: Self-pay | Admitting: *Deleted

## 2014-05-30 MED ORDER — IBUPROFEN 600 MG PO TABS: 600.0000 mg | ORAL_TABLET | Freq: Four times a day (QID) | ORAL | Status: DC | PRN

## 2014-05-30 NOTE — Discharge Summary (Signed)
Obstetric Discharge Summary Reason for Admission: onset of labor Prenatal Procedures: none Intrapartum Procedures: spontaneous vaginal delivery Postpartum Procedures: none Complications-Operative and Postpartum: none Hemoglobin  Date Value Ref Range Status  05/29/2014 10.8* 12.0 - 15.0 g/dL Final     HCT  Date Value Ref Range Status  05/29/2014 31.9* 36.0 - 46.0 % Final    Discharge Diagnoses: Term Pregnancy-delivered  Hospital Course:  Chelsea Strong is a 23 y.o. [id number] who presented with uterine contractions.  She had a uncomplicated SVD. She was able to ambulate, tolerate PO and void normally. She was discharged home with instructions for postpartum care.    Delivery Note At 6:02 AM a viable and healthy female was delivered via (Presentation:OA). APGAR:8 ,9 ; weight 6lb,5 oz.  Placenta status:Shultz intact , 3 vessel cord, with the following complications: None .   Anesthesia: None  Lacerations: None  Est. Blood Loss (mL): 300   Mom to postpartum. Baby to Couplet care / Skin to Skin.   Physical Exam:  General: alert, cooperative and no distress Lochia: appropriate Uterine Fundus: firm DVT Evaluation: No evidence of DVT seen on physical exam. Negative Homan's sign. No cords or calf tenderness.  Discharge Information: Date: 05/30/2014 Activity: pelvic rest Diet: routine Medications: PNV and Ibuprofen Baby feeding: plans to bottle feed Contraception: Depo-Provera Condition: stable Instructions: refer to practice specific booklet Discharge to: home   Newborn Data: Live born female  Birth Weight: 6 lb 5 oz (2863 g) APGAR: 8, 9  Home with mother.  Kathrine Cords, MD [facility] FM PGY-1 05/30/2014, 7:23 AM

## 2014-05-30 NOTE — Discharge Instructions (Signed)

## 2014-05-30 NOTE — Progress Notes (Signed)
Ur chart review completed.  

## 2014-05-30 NOTE — Discharge Summary (Signed)
`````  Attestation of Attending Supervision of Advanced Practitioner: Evaluation and management procedures were performed by the PA/NP/CNM/OB Fellow under my supervision/collaboration. Chart reviewed and agree with management and plan for discharge. I saw the patient, and confirmed care plan. And Birth control plans, DepoProvera, which pt has used in the past long term.  Danella Philson V 05/30/2014 8:26 AM

## 2014-05-31 ENCOUNTER — Encounter: Payer: Medicaid Other | Admitting: Women's Health

## 2014-06-21 ENCOUNTER — Ambulatory Visit (INDEPENDENT_AMBULATORY_CARE_PROVIDER_SITE_OTHER): Payer: Medicaid Other | Admitting: Adult Health

## 2014-06-21 ENCOUNTER — Encounter: Payer: Self-pay | Admitting: Adult Health

## 2014-06-21 ENCOUNTER — Other Ambulatory Visit: Payer: Self-pay | Admitting: *Deleted

## 2014-06-21 DIAGNOSIS — Z3042 Encounter for surveillance of injectable contraceptive: Secondary | ICD-10-CM

## 2014-06-21 DIAGNOSIS — Z3202 Encounter for pregnancy test, result negative: Secondary | ICD-10-CM

## 2014-06-21 DIAGNOSIS — Z3049 Encounter for surveillance of other contraceptives: Secondary | ICD-10-CM

## 2014-06-21 LAB — POCT URINE PREGNANCY: PREG TEST UR: NEGATIVE

## 2014-06-21 MED ORDER — MEDROXYPROGESTERONE ACETATE 150 MG/ML IM SUSP
150.0000 mg | Freq: Once | INTRAMUSCULAR | Status: AC
  Administered 2014-06-21: 150 mg via INTRAMUSCULAR

## 2014-06-22 MED ORDER — PRENATAL MULTIVITAMIN CH: 1.0000 | ORAL_TABLET | Freq: Every day | ORAL | Status: DC

## 2014-06-28 ENCOUNTER — Ambulatory Visit: Payer: Medicaid Other | Admitting: Advanced Practice Midwife

## 2014-07-01 ENCOUNTER — Ambulatory Visit: Payer: Medicaid Other | Admitting: Adult Health

## 2014-07-01 ENCOUNTER — Encounter: Payer: Self-pay | Admitting: *Deleted

## 2014-07-25 ENCOUNTER — Encounter: Payer: Self-pay | Admitting: Adult Health

## 2014-09-13 ENCOUNTER — Ambulatory Visit: Payer: Medicaid Other

## 2014-09-29 ENCOUNTER — Emergency Department (HOSPITAL_COMMUNITY)
Admission: EM | Admit: 2014-09-29 | Discharge: 2014-09-29 | Disposition: A | Discharge status: Home / Self Care | Payer: Medicaid Other | Attending: Emergency Medicine | Admitting: Emergency Medicine

## 2014-09-29 ENCOUNTER — Encounter (HOSPITAL_COMMUNITY): Payer: Self-pay | Admitting: *Deleted

## 2014-09-29 DIAGNOSIS — R197 Diarrhea, unspecified: Secondary | ICD-10-CM | POA: Insufficient documentation

## 2014-09-29 DIAGNOSIS — H6502 Acute serous otitis media, left ear: Secondary | ICD-10-CM

## 2014-09-29 DIAGNOSIS — Z79899 Other long term (current) drug therapy: Secondary | ICD-10-CM | POA: Insufficient documentation

## 2014-09-29 DIAGNOSIS — Z8619 Personal history of other infectious and parasitic diseases: Secondary | ICD-10-CM | POA: Insufficient documentation

## 2014-09-29 DIAGNOSIS — R079 Chest pain, unspecified: Secondary | ICD-10-CM | POA: Insufficient documentation

## 2014-09-29 DIAGNOSIS — R112 Nausea with vomiting, unspecified: Secondary | ICD-10-CM | POA: Insufficient documentation

## 2014-09-29 DIAGNOSIS — J069 Acute upper respiratory infection, unspecified: Secondary | ICD-10-CM | POA: Insufficient documentation

## 2014-09-29 MED ORDER — AZITHROMYCIN 250 MG PO TABS: 250.0000 mg | ORAL_TABLET | Freq: Every day | ORAL | Status: DC

## 2014-09-29 MED ORDER — NAPROXEN 500 MG PO TABS: 500.0000 mg | ORAL_TABLET | Freq: Two times a day (BID) | ORAL | Status: DC

## 2014-09-29 MED ORDER — DM-GUAIFENESIN ER 30-600 MG PO TB12: 1.0000 | ORAL_TABLET | Freq: Two times a day (BID) | ORAL | Status: DC

## 2014-09-29 NOTE — Discharge Instructions (Signed)
Take medications as directed. Take Mucinex DM for cough and to clear the mucus. Take the Naprosyn for the bodyaches. Take Zithromax for the ear infection. Return for any new or worse symptoms. Work note provided.

## 2014-09-29 NOTE — ED Provider Notes (Signed)
CSN: [id number]     Arrival date & time 09/29/14  1332 History  This chart was scribed for Fredia Sorrow, MD by Edison Simon, ED Scribe. This patient was seen in room APA05/APA05 and the patient's care was started at 2:38 PM.    Chief Complaint  Patient presents with  . Cough   Patient is a 24 y.o. female presenting with cough. The history is provided by the patient. No language interpreter was used.  Cough Cough characteristics:  Productive Sputum characteristics:  Yellow Severity:  Moderate Onset quality:  Gradual Duration:  6 days Timing:  Constant Progression:  Worsening Chronicity:  New Context: upper respiratory infection   Relieved by:  Nothing Worsened by:  Nothing tried Ineffective treatments:  None tried Associated symptoms: chest pain, ear pain, fever, headaches, rhinorrhea and sore throat   Associated symptoms: no chills, no rash and no shortness of breath     HPI Comments: Chelsea Strong is a 24 y.o. female who presents to the Emergency Department complaining of  sore throat, body aches, fever, left ear pain, and cough with onset 6 days ago. She states her cough produces yellow phlegm. She denies significant health problems. She denies current pregnancy. She denies nausea, vomiting, diarrhea.  Past Medical History  Diagnosis Date  . History of trichomoniasis   . History of chlamydia   . Medical history non-contributory    Past Surgical History  Procedure Laterality Date  . No past surgeries     Family History  Problem Relation Age of Onset  . Asthma Father   . Cancer Paternal Aunt     breast   History  Substance Use Topics  . Smoking status: Never Smoker   . Smokeless tobacco: Never Used  . Alcohol Use: No   OB History    Gravida Para Term Preterm AB TAB SAB Ectopic Multiple Living   3 3 3       3      Review of Systems  Constitutional: Positive for fever. Negative for chills.  HENT: Positive for ear pain, rhinorrhea and sore throat.   Eyes:  Negative for visual disturbance.  Respiratory: Positive for cough. Negative for shortness of breath.   Cardiovascular: Positive for chest pain. Negative for leg swelling.  Gastrointestinal: Positive for nausea, vomiting and diarrhea. Negative for abdominal pain.  Genitourinary: Negative for dysuria.  Musculoskeletal: Negative for back pain.       Body aches  Skin: Negative for rash.  Neurological: Positive for headaches. Negative for dizziness.  Hematological: Does not bruise/bleed easily.  Psychiatric/Behavioral: Negative for confusion.      Allergies  Review of patient's allergies indicates no known allergies.  Home Medications   Prior to Admission medications   Medication Sig Start Date End Date Taking? Authorizing Provider  medroxyPROGESTERone (DEPO-PROVERA) 150 MG/ML injection Inject 150 mg into the muscle every 3 (three) months.   Yes Historical Provider, MD  azithromycin (ZITHROMAX) 250 MG tablet Take 1 tablet (250 mg total) by mouth daily. Take first 2 tablets together, then 1 every day until finished. 09/29/14   Fredia Sorrow, MD  dextromethorphan-guaiFENesin [facility] DM) 30-600 MG per 12 hr tablet Take 1 tablet by mouth 2 (two) times daily. 09/29/14   Fredia Sorrow, MD  naproxen (NAPROSYN) 500 MG tablet Take 1 tablet (500 mg total) by mouth 2 (two) times daily. 09/29/14   Fredia Sorrow, MD  Prenatal Vit-Fe Fumarate-FA (PRENATAL MULTIVITAMIN) TABS tablet Take 1 tablet by mouth daily. Patient not taking: Reported on 09/29/2014  06/22/14   Estill Dooms, NP   BP 120/75 mmHg  Pulse 92  Temp(Src) 98.2 F (36.8 C) (Oral)  Resp 18  Ht 5\' 1"  (1.549 m)  Wt 137 lb (62.143 kg)  BMI 25.90 kg/m2  SpO2 100%  LMP 09/16/2014 Physical Exam  Constitutional: She is oriented to person, place, and time. She appears well-developed and well-nourished.  HENT:  Head: Normocephalic and atraumatic.  Mucous membranes moist Back of throat is red No exudate Left ear and TM normal Left ear  a little more red than right but otherwise normal  Eyes: Conjunctivae and EOM are normal.  Sclera are clear Pupils normal  Neck: Normal range of motion. Neck supple.  Cardiovascular: Normal rate, regular rhythm and normal heart sounds.   No murmur heard. Pulmonary/Chest: Effort normal and breath sounds normal. No respiratory distress. She has no wheezes. She has no rales.  Abdominal: Soft. Bowel sounds are normal. There is no tenderness.  Musculoskeletal: Normal range of motion. She exhibits no edema.  No swelling in ankles  Neurological: She is alert and oriented to person, place, and time. No cranial nerve deficit. She exhibits normal muscle tone. Coordination normal.  Skin: Skin is warm and dry.  Psychiatric: She has a normal mood and affect.  Nursing note and vitals reviewed.   ED Course  Procedures (including critical care time)  DIAGNOSTIC STUDIES: Oxygen Saturation is 100% on room air, normal by my interpretation.    COORDINATION OF CARE: 2:43 PM Discussed treatment plan with patient at beside, including antibiotics, Mucinex, and Naprosyn. The patient agrees with the plan and has no further questions at this time.   Labs Review Labs Reviewed - No data to display  Imaging Review No results found.   EKG Interpretation None      MDM   Final diagnoses:  URI (upper respiratory infection)  Acute serous otitis media of left ear, recurrence not specified    Symptoms consistent with upper respiratory type infection. Evidence of a left otitis media we'll treat with Zithromax this will also cover for the sore throat if it strep related or for pneumonia. Will treat the bodyaches with Naprosyn. Follow treat the cough and productive mucus with the Mucinex DM. Patient nontoxic no acute distress.   I personally performed the services described in this documentation, which was scribed in my presence. The recorded information has been reviewed and is accurate.     Fredia Sorrow, MD 09/29/14 1510

## 2014-09-29 NOTE — ED Notes (Signed)
Pt with sore throat, body aches, possible fever, left earache and cough

## 2014-11-23 IMAGING — US US OB COMP LESS 14 WK
1 series · 13 of 28 positions shown · non-contrast
Comparison: None.

CLINICAL DATA: Left lower quadrant pain.  Positive pregnancy test.
Clinical suspicion for ovarian torsion.  11-week-9-day gestational
age by LMP.

OBSTETRIC <14 WK US AND TRANSVAGINAL OB US
DOPPLER ULTRASOUND OF OVARIES
TECHNIQUE: Both transabdominal and transvaginal ultrasound
examinations were performed for complete evaluation of the
gestation as well as the maternal uterus, adnexal regions, and
pelvic cul-de-sac.  Transvaginal technique was performed to assess
early pregnancy.
Color and duplex Doppler ultrasound was utilized to evaluate blood
flow to the ovaries.

[Series 1: us ob comp less 14 wk · 0.23mm/px · 13 of 94 slices shown]
[im 4/94]
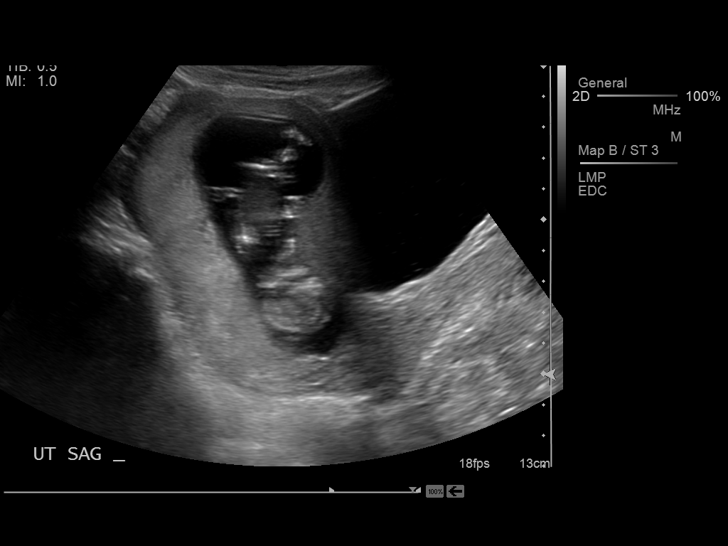
[im 11/94]
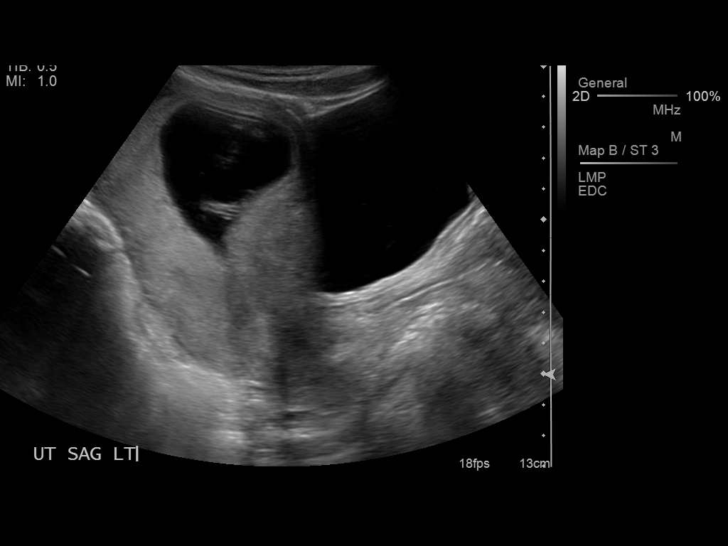
[im 18/94]
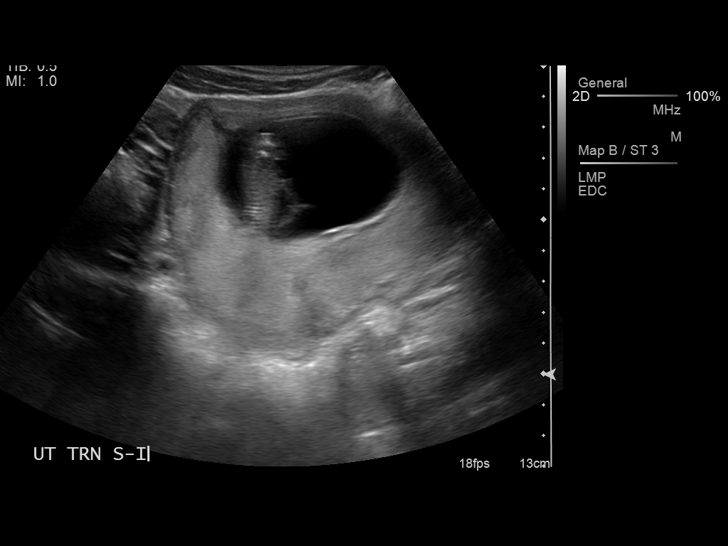
[im 25/94]
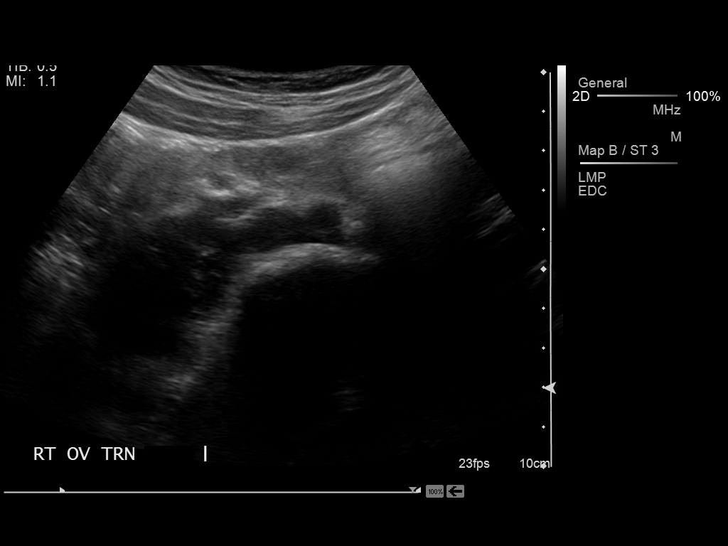
[im 32/94]
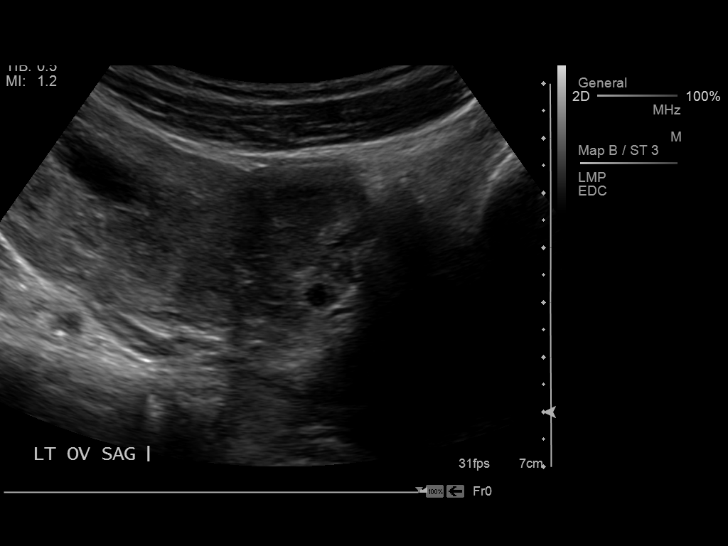
[im 38/94]
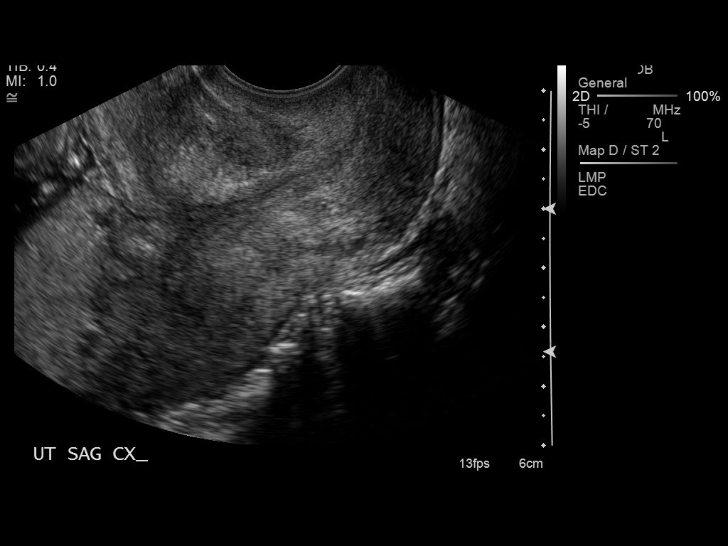
[im 49/94]
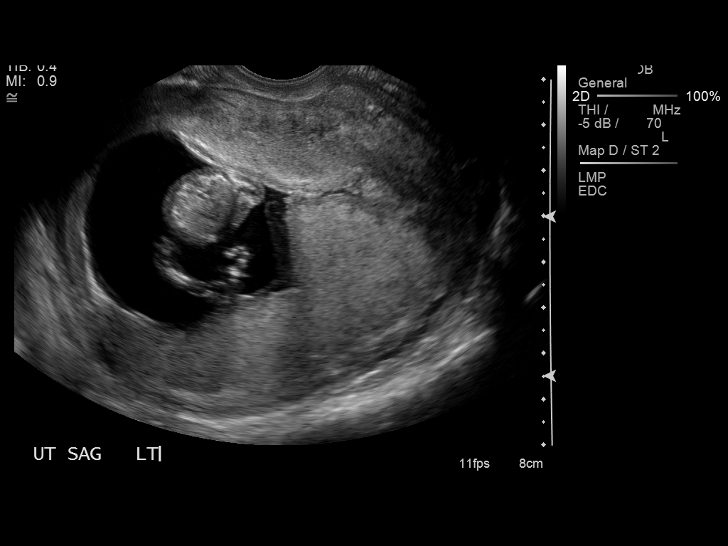
[im 56/94]
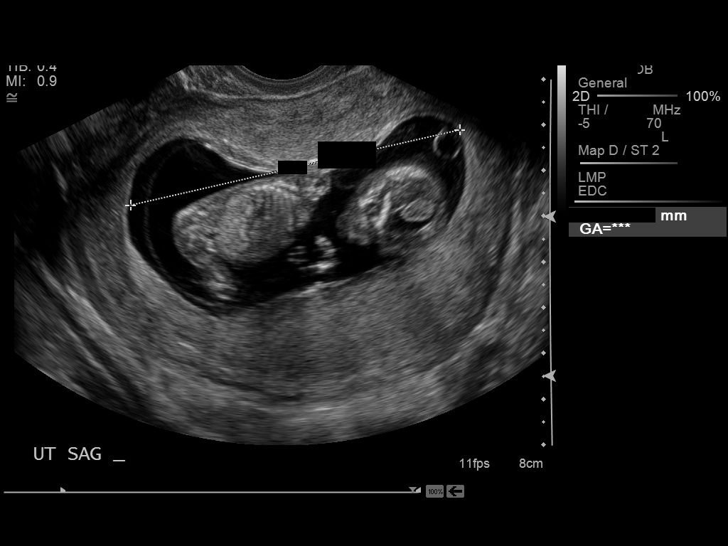
[im 63/94]
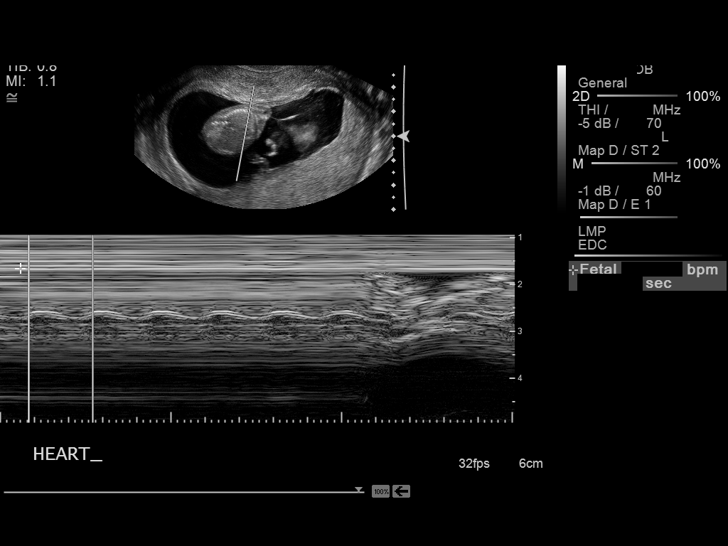
[im 69/94]
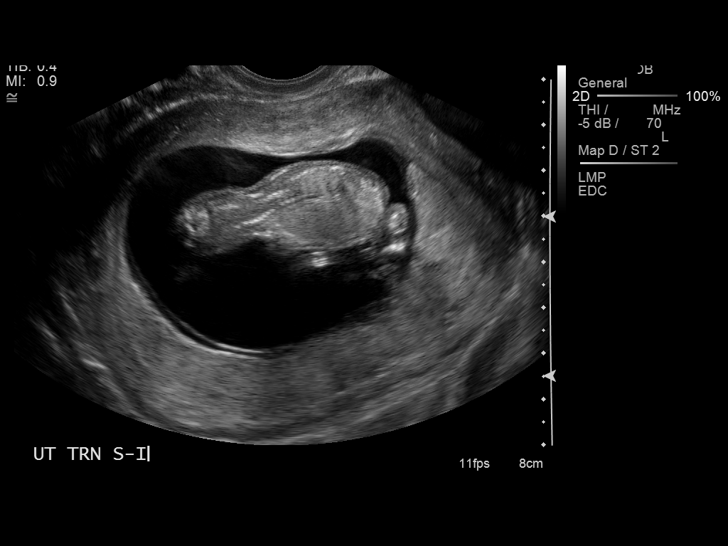
[im 76/94]
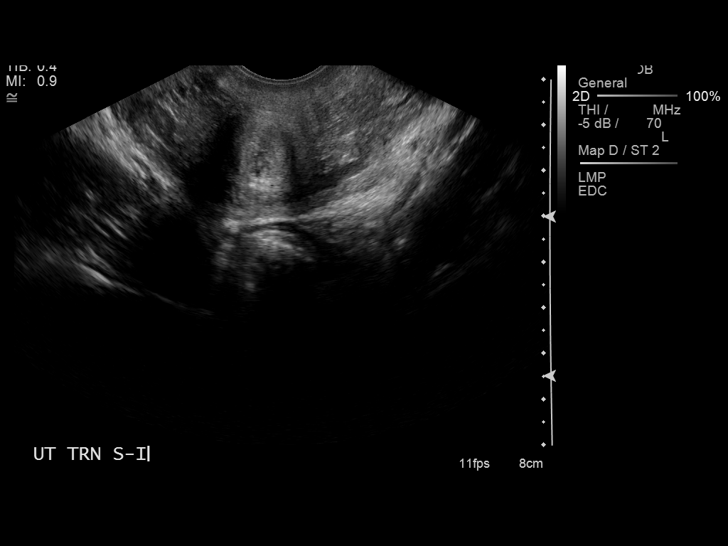
[im 83/94]
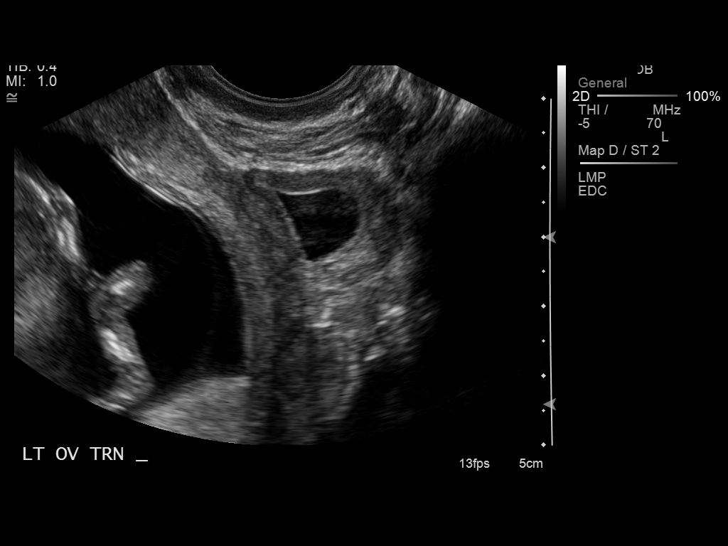
[im 90/94]
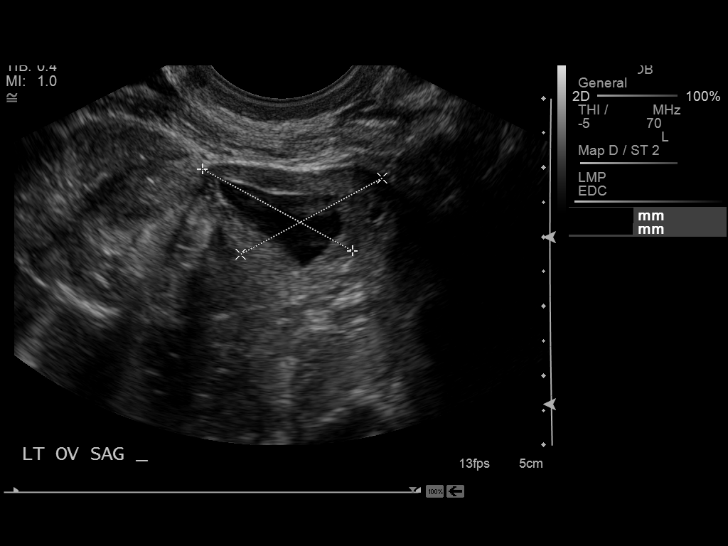

[13 of 28 positions shown; findings below may reference images not displayed]

FINDINGS: Intrauterine gestational sac: Visualized/normal in shape.
Yolk sac:  Visualized
Embryo:  Visualized
Cardiac Activity:  Visualized
Heart Rate:  252bpm

CRL:  61    mm  12w  4 d            US EDC:  07/02/2013

Maternal uterus/adnexae:
No subchorionic hemorrhage seen.  Right ovary not directly
visualized although no right adnexal mass identified.  The left
ovary is normal appearance and contains a small corpus luteum cyst
measuring approximately 1.3 cm.  Internal blood flow is seen within
the left ovary.

Pulsed Doppler evaluation demonstrates normal appearing low
resistance arterial and venous waveforms within the left ovary.
Right ovary not visualized.
IMPRESSION: 1.  Single living IUP measuring 12 weeks 4 days with US EDC of
07/02/2013.
2.  No significant maternal uterine or adnexal abnormality
identified.
3.  No sonographic evidence for left ovarian torsion.
Nonvisualization of right ovary.

## 2015-04-27 ENCOUNTER — Encounter: Payer: Self-pay | Admitting: Obstetrics and Gynecology

## 2015-04-27 ENCOUNTER — Ambulatory Visit (INDEPENDENT_AMBULATORY_CARE_PROVIDER_SITE_OTHER): Payer: Medicaid Other | Admitting: Obstetrics and Gynecology

## 2015-04-27 VITALS — BP 102/58 | HR 64 | Wt 144.4 lb

## 2015-04-27 DIAGNOSIS — Z118 Encounter for screening for other infectious and parasitic diseases: Secondary | ICD-10-CM | POA: Diagnosis not present

## 2015-04-27 DIAGNOSIS — Z309 Encounter for contraceptive management, unspecified: Secondary | ICD-10-CM

## 2015-04-27 DIAGNOSIS — Z1159 Encounter for screening for other viral diseases: Secondary | ICD-10-CM | POA: Diagnosis not present

## 2015-04-27 DIAGNOSIS — Z9889 Other specified postprocedural states: Secondary | ICD-10-CM | POA: Insufficient documentation

## 2015-04-27 NOTE — Addendum Note (Signed)
Addended by: Traci Sermon A on: 04/27/2015 12:04 PM   Modules accepted: Orders

## 2015-04-27 NOTE — Progress Notes (Signed)
Patient ID: Chelsea Strong, female   DOB: 08/12/1991, 24 y.o.   MRN: [id number]    [facility] Visit  Patient name: Chelsea Strong MRN [id number]  Date of birth: [birth year]  CC & HPI:  Chelsea Strong is a 24 y.o. female presenting today for follow-up after elective abortion on 6/22. Pt currently uses the Depo shot for birth control. She states that she became pregnant after she was over a week late for her injection. Pt has 3 children at home. Pt has 1 partner who she has been with for 3 years. She has not been tested for STI recently.  Pt denies any continued pain following the surgery.   ROS:  A complete 10 system review of systems was obtained and all systems are negative except as noted in the HPI and PMH.   Pertinent History Reviewed:   Reviewed. Medical         Past Medical History  Diagnosis Date  . History of trichomoniasis   . History of chlamydia   . Medical history non-contributory                               Surgical Hx:    Past Surgical History  Procedure Laterality Date  . No past surgeries    . Therapeutic abortion  03/15/1015   Medications: Reviewed & Updated - see associated section                       Current outpatient prescriptions:  .  medroxyPROGESTERone (DEPO-PROVERA) 150 MG/ML injection, Inject 150 mg into the muscle every 3 (three) months., Disp: , Rfl:  .  azithromycin (ZITHROMAX) 250 MG tablet, Take 1 tablet (250 mg total) by mouth daily. Take first 2 tablets together, then 1 every day until finished. (Patient not taking: Reported on 04/27/2015), Disp: 6 tablet, Rfl: 0 .  dextromethorphan-guaiFENesin (MUCINEX DM) 30-600 MG per 12 hr tablet, Take 1 tablet by mouth 2 (two) times daily. (Patient not taking: Reported on 04/27/2015), Disp: 14 tablet, Rfl: 0 .  naproxen (NAPROSYN) 500 MG tablet, Take 1 tablet (500 mg total) by mouth 2 (two) times daily. (Patient not taking: Reported on 04/27/2015), Disp: 14 tablet, Rfl: 0 .  Prenatal Vit-Fe  Fumarate-FA (PRENATAL MULTIVITAMIN) TABS tablet, Take 1 tablet by mouth daily. (Patient not taking: Reported on 09/29/2014), Disp: 30 tablet, Rfl: 12   Social History: Reviewed -  reports that she has never smoked. She has never used smokeless tobacco.  Objective Findings:  Vitals: Blood pressure 102/58, pulse 64, weight 144 lb 6.4 oz (65.499 kg), last menstrual period 04/04/2015, unknown if currently breastfeeding.  Physical Examination: General appearance - alert, well appearing, and in no distress and oriented to person, place, and time Mental status - alert, oriented to person, place, and time, normal mood, behavior, speech, dress, motor activity, and thought processes Pelvic - normal external genitalia, vulva, vagina, cervix, uterus and adnexa VULVA: normal appearing vulva with no masses, tenderness or lesions VAGINA: normal appearing vagina with normal color and discharge, no lesions  CERVIX: normal appearing cervix without discharge or lesions UTERUS: uterus is normal size, shape, consistency and nontender  ADNEXA: normal adnexa in size, nontender and no masses   Assessment & Plan:   A:  1. Pt doing well post elective abortion 2. GC/CHL collected  P:  1. Pt to continue Depo shot thru [facility]  This chart was scribed for Jonnie Kind, MD by Tula Nakayama, Medical Scribe. This patient was seen in room 3 and the patient's care was started at 11:49 AM.   I personally performed the services described in this documentation, which was SCRIBED in my presence. The recorded information has been reviewed and considered accurate. It has been edited as necessary during review. Jonnie Kind, MD

## 2015-04-29 LAB — GC/CHLAMYDIA PROBE AMP
Chlamydia trachomatis, NAA: NEGATIVE
Neisseria gonorrhoeae by PCR: NEGATIVE

## 2015-08-24 ENCOUNTER — Ambulatory Visit: Payer: Medicaid Other | Admitting: Adult Health

## 2015-08-24 ENCOUNTER — Encounter: Payer: Self-pay | Admitting: Adult Health

## 2015-09-27 ENCOUNTER — Encounter: Payer: Self-pay | Admitting: Obstetrics & Gynecology

## 2015-09-27 ENCOUNTER — Ambulatory Visit (INDEPENDENT_AMBULATORY_CARE_PROVIDER_SITE_OTHER): Payer: Medicaid Other | Admitting: Obstetrics & Gynecology

## 2015-09-27 VITALS — BP 110/70 | HR 72 | Wt 144.0 lb

## 2015-09-27 DIAGNOSIS — N939 Abnormal uterine and vaginal bleeding, unspecified: Secondary | ICD-10-CM | POA: Diagnosis not present

## 2015-09-27 MED ORDER — MEGESTROL ACETATE 40 MG PO TABS: ORAL_TABLET | ORAL | Status: DC

## 2015-09-27 NOTE — Progress Notes (Signed)
Patient ID: Chelsea Strong, female   DOB: 1991-04-26, 25 y.o.   MRN: [id number] Follow up appointment for results  Chief Complaint  Patient presents with  . gyn visit    BTB on depo.    Blood pressure 110/70, pulse 72, weight 144 lb (65.318 kg), unknown if currently breastfeeding.  No results found.  BTB on Depo sometimes 2 weeks at a time and heavy  Has been going on since her depo started in August  MEDS ordered this encounter: Meds ordered this encounter  Medications  . megestrol (MEGACE) 40 MG tablet    Sig: 3 tablets a day for 5 days, 2 tablets a day for 5 days then 1 tablet daily    Dispense:  45 tablet    Refill:  3    Orders for this encounter: No orders of the defined types were placed in this encounter.    Plan: Megestrol x 1 months Follow Up: prn    Face to face time:  10 minutes  Greater than 50% of the visit time was spent in counseling and coordination of care with the patient.  The summary and outline of the counseling and care coordination is summarized in the note above.   All questions were answered.  Past Medical History  Diagnosis Date  . History of trichomoniasis   . History of chlamydia   . Medical history non-contributory     Past Surgical History  Procedure Laterality Date  . No past surgeries    . Therapeutic abortion  03/15/1015    OB History    Gravida Para Term Preterm AB TAB SAB Ectopic Multiple Living   3 3 3       3       No Known Allergies  Social History   Social History  . Marital Status: Single    Spouse Name: N/A  . Number of Children: N/A  . Years of Education: N/A   Social History Main Topics  . Smoking status: Never Smoker   . Smokeless tobacco: Never Used  . Alcohol Use: No  . Drug Use: No  . Sexual Activity: Not Currently    Birth Control/ Protection: Injection, Inserts   Other Topics Concern  . None   Social History Narrative    Family History  Problem Relation Age of Onset  . Asthma Father    . Cancer Paternal Aunt     breast

## 2015-12-06 ENCOUNTER — Encounter (HOSPITAL_COMMUNITY): Payer: Self-pay

## 2015-12-06 ENCOUNTER — Emergency Department (HOSPITAL_COMMUNITY): Payer: Self-pay

## 2015-12-06 ENCOUNTER — Emergency Department (HOSPITAL_COMMUNITY)
Admission: EM | Admit: 2015-12-06 | Discharge: 2015-12-06 | Disposition: A | Discharge status: Home / Self Care | Payer: Self-pay | Attending: Emergency Medicine | Admitting: Emergency Medicine

## 2015-12-06 DIAGNOSIS — J020 Streptococcal pharyngitis: Secondary | ICD-10-CM | POA: Insufficient documentation

## 2015-12-06 LAB — RAPID STREP SCREEN (MED CTR MEBANE ONLY): Streptococcus, Group A Screen (Direct): POSITIVE — AB

## 2015-12-06 MED ORDER — AMOXICILLIN 250 MG PO CAPS
500.0000 mg | ORAL_CAPSULE | Freq: Once | ORAL | Status: AC
  Administered 2015-12-06: 500 mg via ORAL
  Filled 2015-12-06: qty 2

## 2015-12-06 MED ORDER — AMOXICILLIN 500 MG PO CAPS: 500.0000 mg | ORAL_CAPSULE | Freq: Three times a day (TID) | ORAL | Status: AC

## 2015-12-06 NOTE — ED Notes (Signed)
Patient given discharge instruction, verbalized understand. Patient ambulatory out of the department.  

## 2015-12-06 NOTE — Discharge Instructions (Signed)

## 2015-12-06 NOTE — ED Notes (Signed)
Pt reports nonproductive cough, fever, and generalized body aches and night sweats.  Reports son was recently treated for the flu.

## 2015-12-06 NOTE — ED Notes (Signed)
Pt ambulatory to the bathroom 

## 2015-12-07 NOTE — ED Provider Notes (Signed)
CSN: [id number]     Arrival date & time 12/06/15  1608 History   First MD Initiated Contact with Patient 12/06/15 1734     Chief Complaint  Patient presents with  . Generalized Body Aches     (Consider location/radiation/quality/duration/timing/severity/associated sxs/prior Treatment) The history is provided by the patient.   Chelsea Strong is a 25 y.o. female presenting with a one week history of uri type symptoms including general myalgias, non productive cough, subjective fever and night sweats.  She also endorses having had a sore throat which is now improved. She denies chest pain, sob, abdominal pain, nausea, vomiting, dysuria, diarrhea.  She reports the flu has been in her house and also states one of her children was treated for strep throat last week.  She has taken theraflu with transient sx improvement.     Past Medical History  Diagnosis Date  . History of trichomoniasis   . History of chlamydia   . Medical history non-contributory    Past Surgical History  Procedure Laterality Date  . No past surgeries    . Therapeutic abortion  03/15/1015   Family History  Problem Relation Age of Onset  . Asthma Father   . Cancer Paternal Aunt     breast   Social History  Substance Use Topics  . Smoking status: Never Smoker   . Smokeless tobacco: Never Used  . Alcohol Use: No   OB History    Gravida Para Term Preterm AB TAB SAB Ectopic Multiple Living   3 3 3       3      Review of Systems  Constitutional: Positive for fever and chills.  HENT: Positive for rhinorrhea and sore throat. Negative for congestion, ear pain, sinus pressure, trouble swallowing and voice change.   Eyes: Negative for discharge.  Respiratory: Positive for cough. Negative for shortness of breath, wheezing and stridor.   Cardiovascular: Negative for chest pain.  Gastrointestinal: Negative for nausea, vomiting, abdominal pain and diarrhea.  Genitourinary: Negative.   Musculoskeletal: Positive  for myalgias.  Skin: Negative.       Allergies  Review of patient's allergies indicates no known allergies.  Home Medications   Prior to Admission medications   Medication Sig Start Date End Date Taking? Authorizing Provider  amoxicillin (AMOXIL) 500 MG capsule Take 1 capsule (500 mg total) by mouth 3 (three) times daily. 12/06/15 12/16/15  Evalee Jefferson, PA-C  medroxyPROGESTERone (DEPO-PROVERA) 150 MG/ML injection Inject 150 mg into the muscle every 3 (three) months.    Historical Provider, MD  megestrol (MEGACE) 40 MG tablet 3 tablets a day for 5 days, 2 tablets a day for 5 days then 1 tablet daily 09/27/15   Florian Buff, MD   BP 121/75 mmHg  Pulse 83  Temp(Src) 98.8 F (37.1 C) (Oral)  Resp 15  Ht 5\' 1"  (1.549 m)  Wt 63.957 kg  BMI 26.66 kg/m2  SpO2 100%  LMP 11/29/2015  Breastfeeding? No Physical Exam  Constitutional: She is oriented to person, place, and time. She appears well-developed and well-nourished.  HENT:  Head: Normocephalic and atraumatic.  Right Ear: Tympanic membrane and ear canal normal.  Left Ear: Tympanic membrane and ear canal normal.  Nose: Mucosal edema and rhinorrhea present.  Mouth/Throat: Uvula is midline and mucous membranes are normal. No trismus in the jaw. Posterior oropharyngeal erythema present. No oropharyngeal exudate, posterior oropharyngeal edema or tonsillar abscesses.  Tonsils symmetric.  Eyes: Conjunctivae are normal.  Cardiovascular: Normal rate and normal  heart sounds.   Pulmonary/Chest: Effort normal. No respiratory distress. She has no wheezes. She has no rales.  Abdominal: Soft. There is no tenderness.  Musculoskeletal: Normal range of motion.  Neurological: She is alert and oriented to person, place, and time.  Skin: Skin is warm and dry. No rash noted.  Psychiatric: She has a normal mood and affect.    ED Course  Procedures (including critical care time) Labs Review Labs Reviewed  RAPID STREP SCREEN (NOT AT [facility]) - Abnormal;  Notable for the following:    Streptococcus, Group A Screen (Direct) POSITIVE (*)    All other components within normal limits    Imaging Review Dg Chest 2 View  12/06/2015  CLINICAL DATA:  Nonproductive cough and fever EXAM: CHEST  2 VIEW COMPARISON:  None. FINDINGS: The heart size and mediastinal contours are within normal limits. Both lungs are clear. The visualized skeletal structures are unremarkable. IMPRESSION: No active cardiopulmonary disease. Electronically Signed   By: Inez Catalina M.D.   On: 12/06/2015 19:07   I have personally reviewed and evaluated these images and lab results as part of my medical decision-making.   EKG Interpretation None      MDM   Final diagnoses:  Strep pharyngitis    Amoxil, rest, tylenol or motrin for fever/throat pain. F/u with pcp for a recheck for any worsened sx.    Evalee Jefferson, PA-C 12/07/15 [place], MD 12/09/15 [phone]

## 2016-08-08 ENCOUNTER — Emergency Department (HOSPITAL_COMMUNITY)
Admission: EM | Admit: 2016-08-08 | Discharge: 2016-08-08 | Disposition: A | Discharge status: Home / Self Care | Payer: BLUE CROSS/BLUE SHIELD | Attending: Emergency Medicine | Admitting: Emergency Medicine

## 2016-08-08 ENCOUNTER — Encounter (HOSPITAL_COMMUNITY): Payer: Self-pay | Admitting: Emergency Medicine

## 2016-08-08 DIAGNOSIS — J029 Acute pharyngitis, unspecified: Secondary | ICD-10-CM

## 2016-08-08 DIAGNOSIS — J069 Acute upper respiratory infection, unspecified: Secondary | ICD-10-CM

## 2016-08-08 DIAGNOSIS — R05 Cough: Secondary | ICD-10-CM | POA: Diagnosis present

## 2016-08-08 MED ORDER — AMOXICILLIN 500 MG PO CAPS: 500.0000 mg | ORAL_CAPSULE | Freq: Three times a day (TID) | ORAL | 0 refills | Status: DC

## 2016-08-08 MED ORDER — DEXAMETHASONE 4 MG PO TABS: 4.0000 mg | ORAL_TABLET | Freq: Two times a day (BID) | ORAL | 0 refills | Status: DC

## 2016-08-08 MED ORDER — PREDNISONE 20 MG PO TABS
40.0000 mg | ORAL_TABLET | Freq: Once | ORAL | Status: AC
  Administered 2016-08-08: 40 mg via ORAL
  Filled 2016-08-08: qty 2

## 2016-08-08 MED ORDER — IBUPROFEN 800 MG PO TABS
800.0000 mg | ORAL_TABLET | Freq: Once | ORAL | Status: AC
  Administered 2016-08-08: 800 mg via ORAL
  Filled 2016-08-08: qty 1

## 2016-08-08 MED ORDER — AMOXICILLIN 250 MG PO CAPS
500.0000 mg | ORAL_CAPSULE | Freq: Once | ORAL | Status: AC
  Administered 2016-08-08: 500 mg via ORAL
  Filled 2016-08-08: qty 2

## 2016-08-08 NOTE — ED Triage Notes (Signed)
Pt reports sore throat, hoarseness, cough, and rib pain for 2 days.  States she is coughing up green stuff.

## 2016-08-08 NOTE — ED Notes (Signed)
Multiple symptoms, cough non productive, sore throat, hoarness.

## 2016-08-08 NOTE — Discharge Instructions (Signed)
Your vital signs within normal limits. Please use salt water gargles 3 or 4 times daily. Use ibuprofen every 6 hours, or Tylenol every 4 hours for aching and fever. Please increase liquids. Please use Amoxil and Decadron daily with food. Use the decongestant medication of your choice for nasal congestion. Wash hands frequently.

## 2016-08-08 NOTE — ED Provider Notes (Signed)
[facility] DEPT Provider Note   CSN: [id number] Arrival date & time: 08/08/16  1618     History   Chief Complaint Chief Complaint  Patient presents with  . Sore Throat    HPI Chelsea Strong is a 25 y.o. female.  The history is provided by the patient.  URI   This is a new problem. The current episode started 2 days ago. The problem has been gradually worsening. Associated symptoms include congestion, sneezing, sore throat and cough. Pertinent negatives include no dysuria and no rash. She has tried rest for the symptoms. The treatment provided no relief.    Past Medical History:  Diagnosis Date  . History of chlamydia   . History of trichomoniasis   . Medical history non-contributory     Patient Active Problem List   Diagnosis Date Noted  . Status post elective abortion 04/27/2015  . Active labor at term 05/29/2014  . NSVD (normal spontaneous vaginal delivery) 05/29/2014  . Anemia during pregnancy 01/04/2014  . Supervision of other normal pregnancy 01/03/2014  . Short interval between pregnancies complicating pregnancy, antepartum 01/03/2014  . Late prenatal care 01/03/2014  . History of trichomoniasis 12/01/2012  . History of chlamydia 12/01/2012    Past Surgical History:  Procedure Laterality Date  . NO PAST SURGERIES    . THERAPEUTIC ABORTION  03/15/1015    OB History    Gravida Para Term Preterm AB Living   3 3 3     3    SAB TAB Ectopic Multiple Live Births           3       Home Medications    Prior to Admission medications   Medication Sig Start Date End Date Taking? Authorizing Provider  medroxyPROGESTERone (DEPO-PROVERA) 150 MG/ML injection Inject 150 mg into the muscle every 3 (three) months.   Yes Historical Provider, MD    Family History Family History  Problem Relation Age of Onset  . Asthma Father   . Cancer Paternal Aunt     breast    Social History Social History  Substance Use Topics  . Smoking status: Never Smoker  .  Smokeless tobacco: Never Used  . Alcohol use No     Allergies   Patient has no known allergies.   Review of Systems Review of Systems  Constitutional: Positive for chills.  HENT: Positive for congestion, sneezing and sore throat.   Respiratory: Positive for cough.   Genitourinary: Negative for dysuria.  Skin: Negative for rash.  All other systems reviewed and are negative.    Physical Exam Updated Vital Signs BP 124/74   Pulse 69   Temp 98.3 F (36.8 C) (Oral)   Resp 18   Ht 5\' 2"  (1.575 m)   Wt 67.8 kg   LMP  (LMP Unknown)   SpO2 96%   BMI 27.34 kg/m   Physical Exam  Constitutional: She is oriented to person, place, and time. She appears well-developed and well-nourished.  Non-toxic appearance.  HENT:  Head: Normocephalic.  Right Ear: Tympanic membrane and external ear normal.  Left Ear: Tympanic membrane and external ear normal.  Mouth/Throat: Uvula is midline and mucous membranes are normal. No trismus in the jaw. Uvula swelling present. Posterior oropharyngeal erythema present. No tonsillar abscesses.  Nasal congestion  Eyes: EOM and lids are normal. Pupils are equal, round, and reactive to light.  Neck: Normal range of motion. Neck supple. Carotid bruit is not present.  Cardiovascular: Normal rate, regular rhythm, normal heart  sounds, intact distal pulses and normal pulses.   Pulmonary/Chest: Breath sounds normal. No respiratory distress.  Abdominal: Soft. Bowel sounds are normal. There is no tenderness. There is no guarding.  Musculoskeletal: Normal range of motion.  Lymphadenopathy:       Head (right side): No submandibular adenopathy present.       Head (left side): No submandibular adenopathy present.    She has no cervical adenopathy.  Neurological: She is alert and oriented to person, place, and time. She has normal strength. No cranial nerve deficit or sensory deficit.  Skin: Skin is warm and dry.  Psychiatric: She has a normal mood and affect. Her  speech is normal.  Nursing note and vitals reviewed.    ED Treatments / Results  Labs (all labs ordered are listed, but only abnormal results are displayed) Labs Reviewed - No data to display  EKG  EKG Interpretation None       Radiology No results found.  Procedures Procedures (including critical care time)  Medications Ordered in ED Medications - No data to display   Initial Impression / Assessment and Plan / ED Course  I have reviewed the triage vital signs and the nursing notes.  Pertinent labs & imaging results that were available during my care of the patient were reviewed by me and considered in my medical decision making (see chart for details).  Clinical Course     *I have reviewed nursing notes, vital signs, and all appropriate lab and imaging results for this patient.**  Final Clinical Impressions(s) / ED Diagnoses  Vital signs within normal limits. The examination favors pharyngitis and upper respiratory infection. Patient will be treated with Amoxil, salt water gargles, ibuprofen, and Decadron. Patient is to use the decongestant of her choice. Patient is to return to the emergency department or see the primary physician if not improving.    Final diagnoses:  None    New Prescriptions New Prescriptions   No medications on file     Lily Kocher, PA-C 08/08/16 [place], MD 08/08/16 [phone]

## 2016-08-08 NOTE — ED Notes (Signed)
Patient given discharge instruction, verbalized understand. Patient ambulatory out of the department.  

## 2016-10-09 ENCOUNTER — Emergency Department (HOSPITAL_COMMUNITY)
Admission: EM | Admit: 2016-10-09 | Discharge: 2016-10-09 | Disposition: A | Discharge status: Home / Self Care | Payer: BLUE CROSS/BLUE SHIELD | Attending: Emergency Medicine | Admitting: Emergency Medicine

## 2016-10-09 ENCOUNTER — Encounter (HOSPITAL_COMMUNITY): Payer: Self-pay

## 2016-10-09 DIAGNOSIS — R519 Headache, unspecified: Secondary | ICD-10-CM

## 2016-10-09 DIAGNOSIS — Z79899 Other long term (current) drug therapy: Secondary | ICD-10-CM | POA: Diagnosis not present

## 2016-10-09 DIAGNOSIS — R51 Headache: Secondary | ICD-10-CM | POA: Insufficient documentation

## 2016-10-09 DIAGNOSIS — Y92410 Unspecified street and highway as the place of occurrence of the external cause: Secondary | ICD-10-CM | POA: Insufficient documentation

## 2016-10-09 DIAGNOSIS — Y939 Activity, unspecified: Secondary | ICD-10-CM | POA: Insufficient documentation

## 2016-10-09 DIAGNOSIS — M542 Cervicalgia: Secondary | ICD-10-CM | POA: Diagnosis not present

## 2016-10-09 DIAGNOSIS — Y999 Unspecified external cause status: Secondary | ICD-10-CM | POA: Diagnosis not present

## 2016-10-09 MED ORDER — METHOCARBAMOL 500 MG PO TABS: 500.0000 mg | ORAL_TABLET | Freq: Every evening | ORAL | 0 refills | Status: DC | PRN

## 2016-10-09 MED ORDER — IBUPROFEN 600 MG PO TABS: 600.0000 mg | ORAL_TABLET | Freq: Four times a day (QID) | ORAL | 0 refills | Status: DC | PRN

## 2016-10-09 NOTE — ED Notes (Signed)
Bed: WTR8 Expected date:  Expected time:  Means of arrival:  Comments: 
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
## 2016-10-09 NOTE — Discharge Instructions (Signed)
Take anti-inflammatory medicines for the next week. Take this medicine with food. Take muscle relaxer at bedtime to help you sleep. This medicine makes you drowsy so do not take before driving or work Use a heating pad for sore muscles - use for 20 minutes several times a day

## 2016-10-09 NOTE — ED Notes (Signed)
Verbalized understanding discharge instructions, follow-up, and prescriptions. In no acute distress.  Pt given a work note.
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
## 2017-03-03 ENCOUNTER — Ambulatory Visit: Payer: Medicaid Other | Admitting: Adult Health

## 2017-09-23 NOTE — L&D Delivery Note (Signed)
Delivery Note At 5:30 AM a viable female was delivered via Vaginal, Spontaneous (Presentation:LOT ;LOA  ).  APGAR: 9, 9; weight 3210g.   Placenta status:intact , delivered spontaneously via gentle traction and maternal effort .  Cord: three vessels  with the following complications: loose loop x 1 around left ankle .    Anesthesia:  None Episiotomy: None Lacerations:  N/A Est. Blood Loss (mL): 100   Mom to postpartum.  Baby to Couplet care / Skin to Skin.  Darlina Rumpf, CNM 07/20/2018, 6:52 AM

## 2017-10-15 ENCOUNTER — Encounter: Payer: Self-pay | Admitting: Adult Health

## 2017-10-15 ENCOUNTER — Ambulatory Visit: Payer: BLUE CROSS/BLUE SHIELD | Admitting: Adult Health

## 2017-10-15 ENCOUNTER — Encounter (INDEPENDENT_AMBULATORY_CARE_PROVIDER_SITE_OTHER): Payer: Self-pay

## 2017-10-15 VITALS — BP 110/74 | HR 80 | Ht 61.0 in | Wt 136.0 lb

## 2017-10-15 DIAGNOSIS — O209 Hemorrhage in early pregnancy, unspecified: Secondary | ICD-10-CM | POA: Diagnosis not present

## 2017-10-15 DIAGNOSIS — O200 Threatened abortion: Secondary | ICD-10-CM | POA: Diagnosis not present

## 2017-10-15 MED ORDER — CITRANATAL RX 27-1 MG PO TABS: ORAL_TABLET | ORAL | 12 refills | Status: DC

## 2017-10-15 NOTE — Progress Notes (Signed)
Subjective:     Patient ID: Chelsea Strong, female   DOB: [birth year], 27 y.o.   MRN: [id number]  HPI Chelsea Strong is a 27 year old black female in complaining of bleeding for 1 day, is lighter now.She missed a period and had +HPT, and was told by call service to be seen today.   Review of Systems +missed period +HPT +bleeding for 1 day  Reviewed past medical,surgical, social and family history. Reviewed medications and allergies.     Objective:   Physical Exam BP 110/74 (BP Location: Left Arm, Patient Position: Sitting, Cuff Size: Normal)   Pulse 80   Ht 5\' 1"  (1.549 m)   Wt 136 lb (61.7 kg)   LMP 09/04/2017 (Exact Date)   BMI 25.70 kg/m Had +HPT, would be about 5+6 weeks by LMP with EDD 06/11/18, could not pee in office for UPT. Skin warm and dry. Neck: mid line trachea, normal thyroid, good ROM, no lymphadenopathy noted. Lungs: clear to ausculation bilaterally. Cardiovascular: regular rate and rhythm. Will check QHCG and progesterone now.  Her blood type is A+ in CHL. Discussed this could be miscarriage, or just bleeding in early pregnancy, the labs will give [place] base line and may repeat QHCG in 48 hours.    Assessment:     1. Threatened miscarriage in early pregnancy   2. Vaginal bleeding in pregnancy, first trimester       Plan:     Check QHCG and progesterone today Will schedule dating [place] for 1 week, may have change or cancel depending on labs results Review handouts on First trimester and by [facility] ordered this encounter  Medications  . Prenat w/o [id number] (CITRANATAL RX) 27-1 MG TABS    Sig: Take daily    Dispense:  30 each    Refill:  12    Order Specific Question:   Supervising Provider    Answer:   Tania Ade H [[id number]]

## 2017-10-15 NOTE — Patient Instructions (Signed)
First Trimester of Pregnancy The first trimester of pregnancy is from week 1 until the end of week 13 (months 1 through 3). A week after a sperm fertilizes an egg, the egg will implant on the wall of the uterus. This embryo will begin to develop into a baby. Genes from you and your partner will form the baby. The female genes will determine whether the baby will be a boy or a girl. At 6-8 weeks, the eyes and face will be formed, and the heartbeat can be seen on ultrasound. At the end of 12 weeks, all the baby's organs will be formed. Now that you are pregnant, you will want to do everything you can to have a healthy baby. Two of the most important things are to get good prenatal care and to follow your health care provider's instructions. Prenatal care is all the medical care you receive before the baby's birth. This care will help prevent, find, and treat any problems during the pregnancy and childbirth. Body changes during your first trimester Your body goes through many changes during pregnancy. The changes vary from woman to woman.  You may gain or lose a couple of pounds at first.  You may feel sick to your stomach (nauseous) and you may throw up (vomit). If the vomiting is uncontrollable, call your health care provider.  You may tire easily.  You may develop headaches that can be relieved by medicines. All medicines should be approved by your health care provider.  You may urinate more often. Painful urination may mean you have a bladder infection.  You may develop heartburn as a result of your pregnancy.  You may develop constipation because certain hormones are causing the muscles that push stool through your intestines to slow down.  You may develop hemorrhoids or swollen veins (varicose veins).  Your breasts may begin to grow larger and become tender. Your nipples may stick out more, and the tissue that surrounds them (areola) may become darker.  Your gums may bleed and may be  sensitive to brushing and flossing.  Dark spots or blotches (chloasma, mask of pregnancy) may develop on your face. This will likely fade after the baby is born.  Your menstrual periods will stop.  You may have a loss of appetite.  You may develop cravings for certain kinds of food.  You may have changes in your emotions from day to day, such as being excited to be pregnant or being concerned that something may go wrong with the pregnancy and baby.  You may have more vivid and strange dreams.  You may have changes in your hair. These can include thickening of your hair, rapid growth, and changes in texture. Some women also have hair loss during or after pregnancy, or hair that feels dry or thin. Your hair will most likely return to normal after your baby is born.  What to expect at prenatal visits During a routine prenatal visit:  You will be weighed to make sure you and the baby are growing normally.  Your blood pressure will be taken.  Your abdomen will be measured to track your baby's growth.  The fetal heartbeat will be listened to between weeks 10 and 14 of your pregnancy.  Test results from any previous visits will be discussed.  Your health care provider may ask you:  How you are feeling.  If you are feeling the baby move.  If you have had any abnormal symptoms, such as leaking fluid, bleeding, severe headaches,   or abdominal cramping.  If you are using any tobacco products, including cigarettes, chewing tobacco, and electronic cigarettes.  If you have any questions.  Other tests that may be performed during your first trimester include:  Blood tests to find your blood type and to check for the presence of any previous infections. The tests will also be used to check for low iron levels (anemia) and protein on red blood cells (Rh antibodies). Depending on your risk factors, or if you previously had diabetes during pregnancy, you may have tests to check for high blood  sugar that affects pregnant women (gestational diabetes).  Urine tests to check for infections, diabetes, or protein in the urine.  An ultrasound to confirm the proper growth and development of the baby.  Fetal screens for spinal cord problems (spina bifida) and Down syndrome.  HIV (human immunodeficiency virus) testing. Routine prenatal testing includes screening for HIV, unless you choose not to have this test.  You may need other tests to make sure you and the baby are doing well.  Follow these instructions at home: Medicines  Follow your health care provider's instructions regarding medicine use. Specific medicines may be either safe or unsafe to take during pregnancy.  Take a prenatal vitamin that contains at least 600 micrograms (mcg) of folic acid.  If you develop constipation, try taking a stool softener if your health care provider approves. Eating and drinking  Eat a balanced diet that includes fresh fruits and vegetables, whole grains, good sources of protein such as meat, eggs, or tofu, and low-fat dairy. Your health care provider will help you determine the amount of weight gain that is right for you.  Avoid raw meat and uncooked cheese. These carry germs that can cause birth defects in the baby.  Eating four or five small meals rather than three large meals a day may help relieve nausea and vomiting. If you start to feel nauseous, eating a few soda crackers can be helpful. Drinking liquids between meals, instead of during meals, also seems to help ease nausea and vomiting.  Limit foods that are high in fat and processed sugars, such as fried and sweet foods.  To prevent constipation: ? Eat foods that are high in fiber, such as fresh fruits and vegetables, whole grains, and beans. ? Drink enough fluid to keep your urine clear or pale yellow. Activity  Exercise only as directed by your health care provider. Most women can continue their usual exercise routine during  pregnancy. Try to exercise for 30 minutes at least 5 days a week. Exercising will help you: ? Control your weight. ? Stay in shape. ? Be prepared for labor and delivery.  Experiencing pain or cramping in the lower abdomen or lower back is a good sign that you should stop exercising. Check with your health care provider before continuing with normal exercises.  Try to avoid standing for long periods of time. Move your legs often if you must stand in one place for a long time.  Avoid heavy lifting.  Wear low-heeled shoes and practice good posture.  You may continue to have sex unless your health care provider tells you not to. Relieving pain and discomfort  Wear a good support bra to relieve breast tenderness.  Take warm sitz baths to soothe any pain or discomfort caused by hemorrhoids. Use hemorrhoid cream if your health care provider approves.  Rest with your legs elevated if you have leg cramps or low back pain.  If you develop   varicose veins in your legs, wear support hose. Elevate your feet for 15 minutes, 3-4 times a day. Limit salt in your diet. Prenatal care  Schedule your prenatal visits by the twelfth week of pregnancy. They are usually scheduled monthly at first, then more often in the last 2 months before delivery.  Write down your questions. Take them to your prenatal visits.  Keep all your prenatal visits as told by your health care provider. This is important. Safety  Wear your seat belt at all times when driving.  Make a list of emergency phone numbers, including numbers for family, friends, the hospital, and police and fire departments. General instructions  Ask your health care provider for a referral to a local prenatal education class. Begin classes no later than the beginning of month 6 of your pregnancy.  Ask for help if you have counseling or nutritional needs during pregnancy. Your health care provider can offer advice or refer you to specialists for help  with various needs.  Do not use hot tubs, steam rooms, or saunas.  Do not douche or use tampons or scented sanitary pads.  Do not cross your legs for long periods of time.  Avoid cat litter boxes and soil used by cats. These carry germs that can cause birth defects in the baby and possibly loss of the fetus by miscarriage or stillbirth.  Avoid all smoking, herbs, alcohol, and medicines not prescribed by your health care provider. Chemicals in these products affect the formation and growth of the baby.  Do not use any products that contain nicotine or tobacco, such as cigarettes and e-cigarettes. If you need help quitting, ask your health care provider. You may receive counseling support and other resources to help you quit.  Schedule a dentist appointment. At home, brush your teeth with a soft toothbrush and be gentle when you floss. Contact a health care provider if:  You have dizziness.  You have mild pelvic cramps, pelvic pressure, or nagging pain in the abdominal area.  You have persistent nausea, vomiting, or diarrhea.  You have a bad smelling vaginal discharge.  You have pain when you urinate.  You notice increased swelling in your face, hands, legs, or ankles.  You are exposed to fifth disease or chickenpox.  You are exposed to German measles (rubella) and have never had it. Get help right away if:  You have a fever.  You are leaking fluid from your vagina.  You have spotting or bleeding from your vagina.  You have severe abdominal cramping or pain.  You have rapid weight gain or loss.  You vomit blood or material that looks like coffee grounds.  You develop a severe headache.  You have shortness of breath.  You have any kind of trauma, such as from a fall or a car accident. Summary  The first trimester of pregnancy is from week 1 until the end of week 13 (months 1 through 3).  Your body goes through many changes during pregnancy. The changes vary from  woman to woman.  You will have routine prenatal visits. During those visits, your health care provider will examine you, discuss any test results you may have, and talk with you about how you are feeling. This information is not intended to replace advice given to you by your health care provider. Make sure you discuss any questions you have with your health care provider. Document Released: 09/03/2001 Document Revised: 08/21/2016 Document Reviewed: 08/21/2016 Elsevier Interactive Patient Education  2018 [employer]  

## 2017-10-16 ENCOUNTER — Telehealth: Payer: Self-pay | Admitting: Adult Health

## 2017-10-16 DIAGNOSIS — O200 Threatened abortion: Secondary | ICD-10-CM

## 2017-10-16 LAB — BETA HCG QUANT (REF LAB): HCG QUANT: 906 m[IU]/mL

## 2017-10-16 LAB — PROGESTERONE: PROGESTERONE: 1.9 ng/mL

## 2017-10-16 NOTE — Telephone Encounter (Signed)
Pt aware of labs and that progesterone level is low, will check [facility] tomorrow, and will talk Monday

## 2017-10-18 LAB — BETA HCG QUANT (REF LAB): hCG Quant: 392 m[IU]/mL

## 2017-10-20 ENCOUNTER — Telehealth: Payer: Self-pay | Admitting: Adult Health

## 2017-10-20 DIAGNOSIS — O039 Complete or unspecified spontaneous abortion without complication: Secondary | ICD-10-CM

## 2017-10-20 NOTE — Telephone Encounter (Signed)
Pt aware QHCG dropping, will recheck end of this week and follow til <5, and will cancel [place] 1/30

## 2017-10-21 ENCOUNTER — Ambulatory Visit: Payer: Self-pay | Admitting: Adult Health

## 2017-10-22 ENCOUNTER — Other Ambulatory Visit: Payer: BLUE CROSS/BLUE SHIELD

## 2017-11-25 ENCOUNTER — Encounter: Payer: Self-pay | Admitting: Obstetrics & Gynecology

## 2017-11-25 ENCOUNTER — Ambulatory Visit (INDEPENDENT_AMBULATORY_CARE_PROVIDER_SITE_OTHER): Payer: BLUE CROSS/BLUE SHIELD | Admitting: Obstetrics & Gynecology

## 2017-11-25 VITALS — BP 98/56 | HR 78 | Ht 61.0 in | Wt 139.0 lb

## 2017-11-25 DIAGNOSIS — R110 Nausea: Secondary | ICD-10-CM

## 2017-11-25 DIAGNOSIS — O200 Threatened abortion: Secondary | ICD-10-CM

## 2017-11-25 DIAGNOSIS — N644 Mastodynia: Secondary | ICD-10-CM | POA: Diagnosis not present

## 2017-11-25 DIAGNOSIS — Z3201 Encounter for pregnancy test, result positive: Secondary | ICD-10-CM

## 2017-11-25 LAB — POCT URINE PREGNANCY: Preg Test, Ur: POSITIVE — AB

## 2017-11-25 NOTE — Progress Notes (Signed)
Chief Complaint  Patient presents with  . Follow-up      27 y.o. [id number] Patient's last menstrual period was 09/04/2017 (exact date). The current method of family planning is none.  Outpatient Encounter Medications as of 11/25/2017  Medication Sig  . Prenat w/o A-FeCb-FeGl-DSS-FA (CITRANATAL RX) 27-1 MG TABS Take daily (Patient not taking: Reported on 11/25/2017)   No facility-administered encounter medications on file as of 11/25/2017.     Subjective Chelsea Strong comes in today because she had a positive pregnancy test at home It is noted that she suffered an early pregnancy loss back in January she had decreasing hCG she did not follow them to 0 but of course it definitively point to a pregnancy loss By this time and I respect her pregnancy test to be negative Pregnancy test at home and today in the office are positive and so I am suspicious of a new pregnancy without intervening menses Has been having some nausea and rare emesis with breast tenderness no fever no lower abdominal pain no bleeding Past Medical History:  Diagnosis Date  . History of chlamydia   . History of trichomoniasis   . Medical history non-contributory     Past Surgical History:  Procedure Laterality Date  . NO PAST SURGERIES    . THERAPEUTIC ABORTION  03/15/1015    OB History    Gravida Para Term Preterm AB Living   4 3 3     3    SAB TAB Ectopic Multiple Live Births           3      No Known Allergies  Social History   Socioeconomic History  . Marital status: Single    Spouse name: None  . Number of children: None  . Years of education: None  . Highest education level: None  Social Needs  . Financial resource strain: None  . Food insecurity - worry: None  . Food insecurity - inability: None  . Transportation needs - medical: None  . Transportation needs - non-medical: None  Occupational History  . None  Tobacco Use  . Smoking status: Never Smoker  . Smokeless tobacco:  Never Used  Substance and Sexual Activity  . Alcohol use: No  . Drug use: No  . Sexual activity: Yes    Birth control/protection: None  Other Topics Concern  . None  Social History Narrative  . None    Family History  Problem Relation Age of Onset  . Asthma Father   . Cancer Paternal Aunt        breast    Medications:       Current Outpatient Medications:  .  Prenat w/o A-FeCb-FeGl-DSS-FA (CITRANATAL RX) 27-1 MG TABS, Take daily (Patient not taking: Reported on 11/25/2017), Disp: 30 each, Rfl: 12  Objective Blood pressure (!) 98/56, pulse 78, height 5\' 1"  (1.549 m), weight 139 lb (63 kg), last menstrual period 09/04/2017.  General well-developed well-nourished female no distress She has normal external genitalia no masses no lesions Vagina is pink and moist slight discharge no blood in the vault The cervix is closed no bleeding The uterus is midplane and nontender Adnexa is not normal sized and nontender  Pertinent ROS No burning with urination, frequency or urgency No nausea, vomiting or diarrhea Nor fever chills or other constitutional symptoms   Labs or studies Urine pregnancy test is positive    Impression Diagnoses this Encounter::   ICD-10-CM   1. Threatened miscarriage in  early pregnancy O20.0 Beta hCG quant (ref lab)    Progesterone  2. Pregnancy test positive Z32.01 POCT urine pregnancy    Established relevant diagnosis(es):   Plan/Recommendations: No orders of the defined types were placed in this encounter.   Labs or Scans Ordered: Orders Placed This Encounter  Procedures  . Beta hCG quant (ref lab)  . Progesterone  . POCT urine pregnancy    Management:: There is no doubt that the patient suffered a pregnancy loss back in January but I suspect that this is a new pregnancy As a result I am going to check a quantitative beta-hCG as well as a serum progesterone which should point me in the right direction  My impression is that this is a  new pregnancy she just never had a intervening.  And ovulated about 2 weeks after we last saw her and talk to her the last part of January  I communicated to the patient I believe this is a new pregnancy  Follow up Return if symptoms worsen or fail to improve.        All questions were answered.

## 2017-11-26 LAB — PROGESTERONE: Progesterone: 13.9 ng/mL

## 2017-11-26 LAB — BETA HCG QUANT (REF LAB): HCG QUANT: 3275 m[IU]/mL

## 2017-12-01 ENCOUNTER — Encounter: Payer: Self-pay | Admitting: Obstetrics & Gynecology

## 2017-12-01 ENCOUNTER — Other Ambulatory Visit: Payer: Self-pay | Admitting: Obstetrics & Gynecology

## 2017-12-01 DIAGNOSIS — O200 Threatened abortion: Secondary | ICD-10-CM

## 2017-12-02 ENCOUNTER — Encounter: Payer: Self-pay | Admitting: Obstetrics & Gynecology

## 2017-12-06 ENCOUNTER — Encounter: Payer: Self-pay | Admitting: Obstetrics & Gynecology

## 2017-12-06 LAB — BETA HCG QUANT (REF LAB): hCG Quant: 48698 m[IU]/mL

## 2017-12-07 ENCOUNTER — Encounter: Payer: Self-pay | Admitting: Obstetrics & Gynecology

## 2017-12-11 ENCOUNTER — Encounter: Payer: Self-pay | Admitting: Obstetrics & Gynecology

## 2017-12-12 ENCOUNTER — Encounter: Payer: Self-pay | Admitting: Obstetrics & Gynecology

## 2017-12-22 ENCOUNTER — Ambulatory Visit (INDEPENDENT_AMBULATORY_CARE_PROVIDER_SITE_OTHER): Payer: BLUE CROSS/BLUE SHIELD | Admitting: Obstetrics & Gynecology

## 2017-12-22 ENCOUNTER — Encounter: Payer: Self-pay | Admitting: Obstetrics & Gynecology

## 2017-12-22 VITALS — BP 98/60 | HR 84 | Ht 61.0 in | Wt 143.0 lb

## 2017-12-22 DIAGNOSIS — O200 Threatened abortion: Secondary | ICD-10-CM

## 2017-12-22 NOTE — Progress Notes (Signed)
Follow up appointment for results  Chief Complaint  Patient presents with  . Follow-up    Blood pressure 98/60, pulse 84, height 5\' 1"  (1.549 m), weight 143 lb (64.9 kg), last menstrual period 09/04/2017.  POC sonogram: Single viable IUP with FHR 165 CRL 2.32 cm consistent with [redacted]w[redacted]d ---> 07/26/2018  Will do Epic intergrated sonogram next visit  And new OB appt    MEDS ordered this encounter: No orders of the defined types were placed in this encounter.   Orders for this encounter: Orders Placed This Encounter  Procedures  . US OB Transvaginal    Impression: Threatened abortion - Plan: US OB Transvaginal    Plan: Make NOB appt sonogram   Follow Up: Return in about 2 weeks (around 01/05/2018) for New ob appt and sonogram with Amber.     Face to face time:  15 minutes  Greater than 50% of the visit time was spent in counseling and coordination of care with the patient.  The summary and outline of the counseling and care coordination is summarized in the note above.   All questions were answered.    All questions were answered.  Past Medical History:  Diagnosis Date  . History of chlamydia   . History of trichomoniasis   . Medical history non-contributory     Past Surgical History:  Procedure Laterality Date  . NO PAST SURGERIES    . THERAPEUTIC ABORTION  03/15/1015    OB History    Gravida  4   Para  3   Term  3   Preterm      AB      Living  3     SAB      TAB      Ectopic      Multiple      Live Births  3           No Known Allergies  Social History   Socioeconomic History  . Marital status: Single    Spouse name: Not on file  . Number of children: Not on file  . Years of education: Not on file  . Highest education level: Not on file  Occupational History  . Not on file  Social Needs  . Financial resource strain: Not on file  . Food insecurity:    Worry: Not on file    Inability: Not on file  . Transportation  needs:    Medical: Not on file    Non-medical: Not on file  Tobacco Use  . Smoking status: Never Smoker  . Smokeless tobacco: Never Used  Substance and Sexual Activity  . Alcohol use: No  . Drug use: No  . Sexual activity: Yes    Birth control/protection: None  Lifestyle  . Physical activity:    Days per week: Not on file    Minutes per session: Not on file  . Stress: Not on file  Relationships  . Social connections:    Talks on phone: Not on file    Gets together: Not on file    Attends religious service: Not on file    Active member of club or organization: Not on file    Attends meetings of clubs or organizations: Not on file    Relationship status: Not on file  Other Topics Concern  . Not on file  Social History Narrative  . Not on file    Family History  Problem Relation Age of Onset  . Asthma Father   .  Cancer Paternal Aunt        breast

## 2017-12-24 DIAGNOSIS — Z029 Encounter for administrative examinations, unspecified: Secondary | ICD-10-CM

## 2018-01-08 ENCOUNTER — Other Ambulatory Visit (HOSPITAL_COMMUNITY)
Admission: RE | Admit: 2018-01-08 | Discharge: 2018-01-08 | Disposition: A | Discharge status: Home / Self Care | Payer: BLUE CROSS/BLUE SHIELD | Source: Ambulatory Visit | Attending: Obstetrics & Gynecology | Admitting: Obstetrics & Gynecology

## 2018-01-08 ENCOUNTER — Ambulatory Visit (INDEPENDENT_AMBULATORY_CARE_PROVIDER_SITE_OTHER): Payer: BLUE CROSS/BLUE SHIELD | Admitting: Advanced Practice Midwife

## 2018-01-08 ENCOUNTER — Encounter: Payer: Self-pay | Admitting: Advanced Practice Midwife

## 2018-01-08 ENCOUNTER — Ambulatory Visit (INDEPENDENT_AMBULATORY_CARE_PROVIDER_SITE_OTHER): Payer: BLUE CROSS/BLUE SHIELD

## 2018-01-08 ENCOUNTER — Ambulatory Visit: Payer: BLUE CROSS/BLUE SHIELD | Admitting: *Deleted

## 2018-01-08 ENCOUNTER — Other Ambulatory Visit: Payer: Self-pay | Admitting: Obstetrics & Gynecology

## 2018-01-08 VITALS — BP 100/50 | HR 70 | Wt 141.0 lb

## 2018-01-08 DIAGNOSIS — Z3682 Encounter for antenatal screening for nuchal translucency: Secondary | ICD-10-CM

## 2018-01-08 DIAGNOSIS — O200 Threatened abortion: Secondary | ICD-10-CM

## 2018-01-08 DIAGNOSIS — Z363 Encounter for antenatal screening for malformations: Secondary | ICD-10-CM

## 2018-01-08 DIAGNOSIS — O3680X0 Pregnancy with inconclusive fetal viability, not applicable or unspecified: Secondary | ICD-10-CM | POA: Diagnosis not present

## 2018-01-08 DIAGNOSIS — Z3481 Encounter for supervision of other normal pregnancy, first trimester: Secondary | ICD-10-CM | POA: Diagnosis present

## 2018-01-08 DIAGNOSIS — Z124 Encounter for screening for malignant neoplasm of cervix: Secondary | ICD-10-CM

## 2018-01-08 DIAGNOSIS — Z1389 Encounter for screening for other disorder: Secondary | ICD-10-CM

## 2018-01-08 DIAGNOSIS — Z3490 Encounter for supervision of normal pregnancy, unspecified, unspecified trimester: Secondary | ICD-10-CM | POA: Insufficient documentation

## 2018-01-08 DIAGNOSIS — Z3A12 12 weeks gestation of pregnancy: Secondary | ICD-10-CM | POA: Diagnosis not present

## 2018-01-08 DIAGNOSIS — Z1379 Encounter for other screening for genetic and chromosomal anomalies: Secondary | ICD-10-CM

## 2018-01-08 DIAGNOSIS — Z331 Pregnant state, incidental: Secondary | ICD-10-CM

## 2018-01-08 LAB — POCT URINALYSIS DIPSTICK
Glucose, UA: NEGATIVE
Ketones, UA: NEGATIVE
LEUKOCYTES UA: NEGATIVE
Nitrite, UA: NEGATIVE
Protein, UA: NEGATIVE
RBC UA: NEGATIVE

## 2018-01-08 NOTE — Patient Instructions (Signed)
 First Trimester of Pregnancy The first trimester of pregnancy is from week 1 until the end of week 12 (months 1 through 3). A week after a sperm fertilizes an egg, the egg will implant on the wall of the uterus. This embryo will begin to develop into a baby. Genes from you and your partner are forming the baby. The female genes determine whether the baby is a boy or a girl. At 6-8 weeks, the eyes and face are formed, and the heartbeat can be seen on ultrasound. At the end of 12 weeks, all the baby's organs are formed.  Now that you are pregnant, you will want to do everything you can to have a healthy baby. Two of the most important things are to get good prenatal care and to follow your health care provider's instructions. Prenatal care is all the medical care you receive before the baby's birth. This care will help prevent, find, and treat any problems during the pregnancy and childbirth. BODY CHANGES Your body goes through many changes during pregnancy. The changes vary from woman to woman.   You may gain or lose a couple of pounds at first.  You may feel sick to your stomach (nauseous) and throw up (vomit). If the vomiting is uncontrollable, call your health care provider.  You may tire easily.  You may develop headaches that can be relieved by medicines approved by your health care provider.  You may urinate more often. Painful urination may mean you have a bladder infection.  You may develop heartburn as a result of your pregnancy.  You may develop constipation because certain hormones are causing the muscles that push waste through your intestines to slow down.  You may develop hemorrhoids or swollen, bulging veins (varicose veins).  Your breasts may begin to grow larger and become tender. Your nipples may stick out more, and the tissue that surrounds them (areola) may become darker.  Your gums may bleed and may be sensitive to brushing and flossing.  Dark spots or blotches  (chloasma, mask of pregnancy) may develop on your face. This will likely fade after the baby is born.  Your menstrual periods will stop.  You may have a loss of appetite.  You may develop cravings for certain kinds of food.  You may have changes in your emotions from day to day, such as being excited to be pregnant or being concerned that something may go wrong with the pregnancy and baby.  You may have more vivid and strange dreams.  You may have changes in your hair. These can include thickening of your hair, rapid growth, and changes in texture. Some women also have hair loss during or after pregnancy, or hair that feels dry or thin. Your hair will most likely return to normal after your baby is born. WHAT TO EXPECT AT YOUR PRENATAL VISITS During a routine prenatal visit:  You will be weighed to make sure you and the baby are growing normally.  Your blood pressure will be taken.  Your abdomen will be measured to track your baby's growth.  The fetal heartbeat will be listened to starting around week 10 or 12 of your pregnancy.  Test results from any previous visits will be discussed. Your health care provider may ask you:  How you are feeling.  If you are feeling the baby move.  If you have had any abnormal symptoms, such as leaking fluid, bleeding, severe headaches, or abdominal cramping.  If you have any questions. Other   tests that may be performed during your first trimester include:  Blood tests to find your blood type and to check for the presence of any previous infections. They will also be used to check for low iron levels (anemia) and Rh antibodies. Later in the pregnancy, blood tests for diabetes will be done along with other tests if problems develop.  Urine tests to check for infections, diabetes, or protein in the urine.  An ultrasound to confirm the proper growth and development of the baby.  An amniocentesis to check for possible genetic problems.  Fetal  screens for spina bifida and Down syndrome.  You may need other tests to make sure you and the baby are doing well. HOME CARE INSTRUCTIONS  Medicines  Follow your health care provider's instructions regarding medicine use. Specific medicines may be either safe or unsafe to take during pregnancy.  Take your prenatal vitamins as directed.  If you develop constipation, try taking a stool softener if your health care provider approves. Diet  Eat regular, well-balanced meals. Choose a variety of foods, such as meat or vegetable-based protein, fish, milk and low-fat dairy products, vegetables, fruits, and whole grain breads and cereals. Your health care provider will help you determine the amount of weight gain that is right for you.  Avoid raw meat and uncooked cheese. These carry germs that can cause birth defects in the baby.  Eating four or five small meals rather than three large meals a day may help relieve nausea and vomiting. If you start to feel nauseous, eating a few soda crackers can be helpful. Drinking liquids between meals instead of during meals also seems to help nausea and vomiting.  If you develop constipation, eat more high-fiber foods, such as fresh vegetables or fruit and whole grains. Drink enough fluids to keep your urine clear or pale yellow. Activity and Exercise  Exercise only as directed by your health care provider. Exercising will help you:  Control your weight.  Stay in shape.  Be prepared for labor and delivery.  Experiencing pain or cramping in the lower abdomen or low back is a good sign that you should stop exercising. Check with your health care provider before continuing normal exercises.  Try to avoid standing for long periods of time. Move your legs often if you must stand in one place for a long time.  Avoid heavy lifting.  Wear low-heeled shoes, and practice good posture.  You may continue to have sex unless your health care provider directs you  otherwise. Relief of Pain or Discomfort  Wear a good support bra for breast tenderness.   Take warm sitz baths to soothe any pain or discomfort caused by hemorrhoids. Use hemorrhoid cream if your health care provider approves.   Rest with your legs elevated if you have leg cramps or low back pain.  If you develop varicose veins in your legs, wear support hose. Elevate your feet for 15 minutes, 3-4 times a day. Limit salt in your diet. Prenatal Care  Schedule your prenatal visits by the twelfth week of pregnancy. They are usually scheduled monthly at first, then more often in the last 2 months before delivery.  Write down your questions. Take them to your prenatal visits.  Keep all your prenatal visits as directed by your health care provider. Safety  Wear your seat belt at all times when driving.  Make a list of emergency phone numbers, including numbers for family, friends, the hospital, and police and fire departments. General   Tips  Ask your health care provider for a referral to a local prenatal education class. Begin classes no later than at the beginning of month 6 of your pregnancy.  Ask for help if you have counseling or nutritional needs during pregnancy. Your health care provider can offer advice or refer you to specialists for help with various needs.  Do not use hot tubs, steam rooms, or saunas.  Do not douche or use tampons or scented sanitary pads.  Do not cross your legs for long periods of time.  Avoid cat litter boxes and soil used by cats. These carry germs that can cause birth defects in the baby and possibly loss of the fetus by miscarriage or stillbirth.  Avoid all smoking, herbs, alcohol, and medicines not prescribed by your health care provider. Chemicals in these affect the formation and growth of the baby.  Schedule a dentist appointment. At home, brush your teeth with a soft toothbrush and be gentle when you floss. SEEK MEDICAL CARE IF:   You have  dizziness.  You have mild pelvic cramps, pelvic pressure, or nagging pain in the abdominal area.  You have persistent nausea, vomiting, or diarrhea.  You have a bad smelling vaginal discharge.  You have pain with urination.  You notice increased swelling in your face, hands, legs, or ankles. SEEK IMMEDIATE MEDICAL CARE IF:   You have a fever.  You are leaking fluid from your vagina.  You have spotting or bleeding from your vagina.  You have severe abdominal cramping or pain.  You have rapid weight gain or loss.  You vomit blood or material that looks like coffee grounds.  You are exposed to German measles and have never had them.  You are exposed to fifth disease or chickenpox.  You develop a severe headache.  You have shortness of breath.  You have any kind of trauma, such as from a fall or a car accident. Document Released: 09/03/2001 Document Revised: 01/24/2014 Document Reviewed: 07/20/2013 [employer] Patient Information 2015 [employer]. This information is not intended to replace advice given to you by your health care provider. Make sure you discuss any questions you have with your health care provider.   Nausea & Vomiting  Have saltine crackers or pretzels by your bed and eat a few bites before you raise your head out of bed in the morning  Eat small frequent meals throughout the day instead of large meals  Drink plenty of fluids throughout the day to stay hydrated, just don't drink a lot of fluids with your meals.  This can make your stomach fill up faster making you feel sick  Do not brush your teeth right after you eat  Products with real ginger are good for nausea, like ginger ale and ginger hard candy Make sure it says made with real ginger!  Sucking on sour candy like lemon heads is also good for nausea  If your prenatal vitamins make you nauseated, take them at night so you will sleep through the nausea  Sea Bands  If you feel like you need  medicine for the nausea & vomiting please let us know  If you are unable to keep any fluids or food down please let us know   Constipation  Drink plenty of fluid, preferably water, throughout the day  Eat foods high in fiber such as fruits, vegetables, and grains  Exercise, such as walking, is a good way to keep your bowels regular  Drink warm fluids, especially warm   prune juice, or decaf coffee  Eat a 1/2 cup of real oatmeal (not instant), 1/2 cup applesauce, and 1/2-1 cup warm prune juice every day  If needed, you may take Colace (docusate sodium) stool softener once or twice a day to help keep the stool soft. If you are pregnant, wait until you are out of your first trimester (12-14 weeks of pregnancy)  If you still are having problems with constipation, you may take Miralax once daily as needed to help keep your bowels regular.  If you are pregnant, wait until you are out of your first trimester (12-14 weeks of pregnancy)  Safe Medications in Pregnancy   Acne: Benzoyl Peroxide Salicylic Acid  Backache/Headache: Tylenol: 2 regular strength every 4 hours OR              2 Extra strength every 6 hours  Colds/Coughs/Allergies: Benadryl (alcohol free) 25 mg every 6 hours as needed Breath right strips Claritin Cepacol throat lozenges Chloraseptic throat spray Cold-Eeze- up to three times per day Cough drops, alcohol free Flonase (by prescription only) Guaifenesin Mucinex Robitussin DM (plain only, alcohol free) Saline nasal spray/drops Sudafed (pseudoephedrine) & Actifed ** use only after [redacted] weeks gestation and if you do not have high blood pressure Tylenol Vicks Vaporub Zinc lozenges Zyrtec   Constipation: Colace Ducolax suppositories Fleet enema Glycerin suppositories Metamucil Milk of magnesia Miralax Senokot Smooth move tea  Diarrhea: Kaopectate Imodium A-D  *NO pepto Bismol  Hemorrhoids: Anusol Anusol HC Preparation  H Tucks  Indigestion: Tums Maalox Mylanta Zantac  Pepcid  Insomnia: Benadryl (alcohol free) 25mg every 6 hours as needed Tylenol PM Unisom, no Gelcaps  Leg Cramps: Tums MagGel  Nausea/Vomiting:  Bonine Dramamine Emetrol Ginger extract Sea bands Meclizine  Nausea medication to take during pregnancy:  Unisom (doxylamine succinate 25 mg tablets) Take one tablet daily at bedtime. If symptoms are not adequately controlled, the dose can be increased to a maximum recommended dose of two tablets daily (1/2 tablet in the morning, 1/2 tablet mid-afternoon and one at bedtime). Vitamin B6 100mg tablets. Take one tablet twice a day (up to 200 mg per day).  Skin Rashes: Aveeno products Benadryl cream or 25mg every 6 hours as needed Calamine Lotion 1% cortisone cream  Yeast infection: Gyne-lotrimin 7 Monistat 7   **If taking multiple medications, please check labels to avoid duplicating the same active ingredients **take medication as directed on the label ** Do not exceed 4000 mg of tylenol in 24 hours **Do not take medications that contain aspirin or ibuprofen      

## 2018-01-08 NOTE — Progress Notes (Signed)
[place] 12+2 wks,single IUP,positive fht 178 bpm,CRL 53.97 mm,anterior pl gr 0,NB present,NT 1.1 mm,EDD 07/21/2018

## 2018-01-08 NOTE — Progress Notes (Signed)
Subjective:    Chelsea Strong is a [id number] [redacted]w[redacted]d being seen today for her first obstetrical visit.  Her obstetrical history is significant for 3 terms SVD, 2 TABs w/o problems.  Pregnancy history fully reviewed.  Patient reports no complaints.  Vitals:   01/08/18 1049  BP: (!) 100/50  Pulse: 70  Weight: 141 lb (64 kg)    HISTORY: OB History  Gravida Para Term Preterm AB Living  6 3 3   2 3   SAB TAB Ectopic Multiple Live Births  1 1     3     # Outcome Date GA Lbr Len/2nd Weight Sex Delivery Anes PTL Lv  6 Current           5 SAB 09/2017          4 TAB 03/15/15          3 Term 05/29/14 [redacted]w[redacted]d 04:55 / 00:07 6 lb 5 oz (2.863 kg) F Vag-Spont None N LIV     Birth Comments: Neg HIv, Hep B, RPR Initial abnormal TSH on newborn screen, but repeats have been normal  2 Term 07/01/13 [redacted]w[redacted]d 16:51 / 01:07 7 lb 6.2 oz (3.35 kg) M Vag-Spont EPI N LIV     Birth Comments: None  1 Term 01/14/09 [redacted]w[redacted]d 09:00 6 lb 7 oz (2.92 kg) M Vag-Vacuum EPI N LIV   Past Medical History:  Diagnosis Date  . History of chlamydia   . History of trichomoniasis    Past Surgical History:  Procedure Laterality Date  . NO PAST SURGERIES    . THERAPEUTIC ABORTION  03/15/1015   Family History  Problem Relation Age of Onset  . Asthma Father   . Diabetes Father   . Cancer Paternal Aunt        breast     Exam       Pelvic Exam:    Perineum: Normal Perineum   Vulva: normal   Vagina:  normal mucosa, normal discharge, no palpable nodules   Uterus Normal,      Cervix: normal   Adnexa: Not palpable   Urinary:  urethral meatus normal    System:     Skin: normal coloration and turgor, no rashes    Neurologic: oriented, normal, normal mood   Extremities: normal strength, tone, and muscle mass   HEENT PERRLA   Mouth/Teeth mucous membranes moist, normal dentition   Neck supple and no masses   Cardiovascular: regular rate and rhythm   Respiratory:  appears well, vitals normal, no respiratory  distress, acyanotic   Abdomen: soft, non-tender       [place] 12+2 wks,single IUP,positive fht 178 bpm,CRL 53.97 mm,anterior pl gr 0,NB present,NT 1.1 mm,EDD 07/21/2018   The nature of [facility] with multiple MDs and other Advanced Practice Providers was explained to patient; also emphasized that residents, students are part of our team.  Assessment:    Pregnancy: [id number] Patient Active Problem List   Diagnosis Date Noted  . Supervision of normal pregnancy 01/08/2018  . Status post elective abortion 04/27/2015  . History of trichomoniasis 12/01/2012  . History of chlamydia 12/01/2012        Plan:  Integrated Screen  Initial labs drawn. Continue prenatal vitamins  Problem list reviewed and updated  Reviewed n/v relief measures and warning s/s to report  Reviewed recommended weight gain based on pre-gravid BMI  Encouraged well-balanced diet Genetic Screening discussed Integrated Screen: requested.  Ultrasound discussed; fetal survey: requested.  Return  in about 6 weeks (around 02/17/2018) for LROB, [id number].  Christin Fudge 01/08/2018

## 2018-01-09 DIAGNOSIS — Z3481 Encounter for supervision of other normal pregnancy, first trimester: Secondary | ICD-10-CM

## 2018-01-09 LAB — PMP SCREEN PROFILE (10S), URINE
Amphetamine Scrn, Ur: NEGATIVE ng/mL
BARBITURATE SCREEN URINE: NEGATIVE ng/mL
BENZODIAZEPINE SCREEN, URINE: NEGATIVE ng/mL
CANNABINOIDS UR QL SCN: NEGATIVE ng/mL
COCAINE(METAB.)SCREEN, URINE: NEGATIVE ng/mL
CREATININE(CRT), U: 211.2 mg/dL (ref 20.0–300.0)
METHADONE SCREEN, URINE: NEGATIVE ng/mL
OXYCODONE+OXYMORPHONE UR QL SCN: NEGATIVE ng/mL
Opiate Scrn, Ur: NEGATIVE ng/mL
PHENCYCLIDINE QUANTITATIVE URINE: NEGATIVE ng/mL
PROPOXYPHENE SCREEN URINE: NEGATIVE ng/mL
Ph of Urine: 5.7 (ref 4.5–8.9)

## 2018-01-09 LAB — OBSTETRIC PANEL, INCLUDING HIV
Antibody Screen: NEGATIVE
BASOS: 1 %
Basophils Absolute: 0.1 10*3/uL (ref 0.0–0.2)
EOS (ABSOLUTE): 0 10*3/uL (ref 0.0–0.4)
Eos: 1 %
HEP B S AG: NEGATIVE
HIV Screen 4th Generation wRfx: NONREACTIVE
Hematocrit: 35.7 % (ref 34.0–46.6)
Hemoglobin: 11.8 g/dL (ref 11.1–15.9)
Immature Grans (Abs): 0 10*3/uL (ref 0.0–0.1)
Immature Granulocytes: 0 %
Lymphocytes Absolute: 3.1 10*3/uL (ref 0.7–3.1)
Lymphs: 43 %
MCH: 28.3 pg (ref 26.6–33.0)
MCHC: 33.1 g/dL (ref 31.5–35.7)
MCV: 86 fL (ref 79–97)
MONOS ABS: 0.6 10*3/uL (ref 0.1–0.9)
Monocytes: 8 %
Neutrophils Absolute: 3.4 10*3/uL (ref 1.4–7.0)
Neutrophils: 47 %
Platelets: 314 10*3/uL (ref 150–379)
RBC: 4.17 x10E6/uL (ref 3.77–5.28)
RDW: 13.4 % (ref 12.3–15.4)
RPR: NONREACTIVE
Rh Factor: POSITIVE
Rubella Antibodies, IGG: 3.92 index (ref >0.99)
WBC: 7.2 10*3/uL (ref 3.4–10.8)

## 2018-01-09 LAB — URINALYSIS, ROUTINE W REFLEX MICROSCOPIC
Bilirubin, UA: NEGATIVE
Glucose, UA: NEGATIVE
Ketones, UA: NEGATIVE
Leukocytes, UA: NEGATIVE
NITRITE UA: NEGATIVE
Protein, UA: NEGATIVE
RBC, UA: NEGATIVE
Specific Gravity, UA: 1.029 (ref 1.005–1.030)
Urobilinogen, Ur: 0.2 mg/dL (ref 0.2–1.0)
pH, UA: 5.5 (ref 5.0–7.5)

## 2018-01-10 LAB — INTEGRATED 1
Crown Rump Length: 54 mm
GEST. AGE ON COLLECTION DATE: 11.9 wk
Maternal Age at EDD: 26.8 yr
Nuchal Translucency (NT): 1.1 mm
Number of Fetuses: 1
PAPP-A Value: 711.6 ng/mL
WEIGHT: 141 [lb_av]

## 2018-01-10 LAB — URINE CULTURE: Organism ID, Bacteria: NO GROWTH

## 2018-01-13 LAB — CYTOLOGY - PAP
Chlamydia: NEGATIVE
Neisseria Gonorrhea: NEGATIVE

## 2018-01-14 ENCOUNTER — Encounter: Payer: Self-pay | Admitting: Advanced Practice Midwife

## 2018-01-14 ENCOUNTER — Encounter (INDEPENDENT_AMBULATORY_CARE_PROVIDER_SITE_OTHER): Payer: Self-pay

## 2018-01-15 ENCOUNTER — Encounter: Payer: Self-pay | Admitting: Advanced Practice Midwife

## 2018-01-23 ENCOUNTER — Encounter: Payer: Self-pay | Admitting: Obstetrics and Gynecology

## 2018-01-23 ENCOUNTER — Ambulatory Visit (INDEPENDENT_AMBULATORY_CARE_PROVIDER_SITE_OTHER): Payer: BLUE CROSS/BLUE SHIELD | Admitting: Obstetrics and Gynecology

## 2018-01-23 VITALS — BP 100/44 | HR 69 | Wt 144.8 lb

## 2018-01-23 DIAGNOSIS — Z331 Pregnant state, incidental: Secondary | ICD-10-CM | POA: Diagnosis not present

## 2018-01-23 DIAGNOSIS — N870 Mild cervical dysplasia: Secondary | ICD-10-CM | POA: Diagnosis not present

## 2018-01-23 DIAGNOSIS — Z1389 Encounter for screening for other disorder: Secondary | ICD-10-CM | POA: Diagnosis not present

## 2018-01-23 LAB — POCT URINALYSIS DIPSTICK
GLUCOSE UA: NEGATIVE
KETONES UA: NEGATIVE
Leukocytes, UA: NEGATIVE
Nitrite, UA: NEGATIVE
Protein, UA: NEGATIVE
RBC UA: NEGATIVE

## 2018-01-23 NOTE — Progress Notes (Signed)
  Chelsea Strong 27 y.o. [id number] here for colposcopy for low-grade squamous intraepithelial neoplasia (LGSIL - encompassing HPV,mild dysplasia,CIN I) pap smear on 01/08/18. She is [redacted] weeks pregnant. FHR: 156 bpm. She plans on breast-feeding her baby. She will use the shot for birth control.  Discussed role for HPV in cervical dysplasia, need for surveillance. Colposcopy Patient given informed consent, signed copy in the chart, time out was performed.  Placed in lithotomy position. Cervix viewed with speculum and colposcope after application of acetic acid.   Colposcopy adequate? Yes  no mosaicism, cervix is multiparous, several tiny area of white epithelium on anterior lip of cervix Small area at 5:00 at posterior lip of cervix   Colposcopy IMPRESSION: CIN 1 Colposcopic biopsies at 6 weeks post partum  Patient was given post procedure instructions. Will follow up pathology and manage accordingly.  Routine preventative health maintenance measures emphasized.   By signing my name below, I, Izna Ahmed, attest that this documentation has been prepared under the direction and in the presence of Jonnie Kind, MD. Electronically Signed: Jabier Gauss, Medical Scribe. 01/23/18. 11:43 AM.  I personally performed the services described in this documentation, which was SCRIBED in my presence. The recorded information has been reviewed and considered accurate. It has been edited as necessary during review. Jonnie Kind, MD

## 2018-01-27 ENCOUNTER — Encounter: Payer: Self-pay | Admitting: Advanced Practice Midwife

## 2018-02-02 ENCOUNTER — Telehealth: Payer: Self-pay | Admitting: *Deleted

## 2018-02-02 ENCOUNTER — Encounter: Payer: Self-pay | Admitting: Women's Health

## 2018-02-02 ENCOUNTER — Ambulatory Visit (INDEPENDENT_AMBULATORY_CARE_PROVIDER_SITE_OTHER): Payer: BLUE CROSS/BLUE SHIELD | Admitting: Women's Health

## 2018-02-02 VITALS — BP 110/60 | HR 98 | Wt 145.0 lb

## 2018-02-02 DIAGNOSIS — N930 Postcoital and contact bleeding: Secondary | ICD-10-CM | POA: Diagnosis not present

## 2018-02-02 DIAGNOSIS — R87619 Unspecified abnormal cytological findings in specimens from cervix uteri: Secondary | ICD-10-CM | POA: Insufficient documentation

## 2018-02-02 DIAGNOSIS — Z1389 Encounter for screening for other disorder: Secondary | ICD-10-CM

## 2018-02-02 DIAGNOSIS — Z3A15 15 weeks gestation of pregnancy: Secondary | ICD-10-CM | POA: Diagnosis not present

## 2018-02-02 DIAGNOSIS — O9989 Other specified diseases and conditions complicating pregnancy, childbirth and the puerperium: Secondary | ICD-10-CM

## 2018-02-02 DIAGNOSIS — Z113 Encounter for screening for infections with a predominantly sexual mode of transmission: Secondary | ICD-10-CM

## 2018-02-02 DIAGNOSIS — Z331 Pregnant state, incidental: Secondary | ICD-10-CM

## 2018-02-02 DIAGNOSIS — Z3482 Encounter for supervision of other normal pregnancy, second trimester: Secondary | ICD-10-CM

## 2018-02-02 LAB — POCT URINALYSIS DIPSTICK
Blood, UA: NEGATIVE
GLUCOSE UA: NEGATIVE
KETONES UA: NEGATIVE
Leukocytes, UA: NEGATIVE
Nitrite, UA: NEGATIVE

## 2018-02-02 LAB — POCT WET PREP (WET MOUNT)
Clue Cells Wet Prep Whiff POC: NEGATIVE
Trichomonas Wet Prep HPF POC: ABSENT

## 2018-02-02 NOTE — Patient Instructions (Signed)
Chelsea Strong, I greatly value your feedback.  If you receive a survey following your visit with [place] today, we appreciate you taking the time to fill it out.  Thanks, [name], CNM, WHNP-BC   Second Trimester of Pregnancy The second trimester is from week 14 through week 27 (months 4 through 6). The second trimester is often a time when you feel your best. Your body has adjusted to being pregnant, and you begin to feel better physically. Usually, morning sickness has lessened or quit completely, you may have more energy, and you may have an increase in appetite. The second trimester is also a time when the fetus is growing rapidly. At the end of the sixth month, the fetus is about 9 inches long and weighs about 1 pounds. You will likely begin to feel the baby move (quickening) between 16 and 20 weeks of pregnancy. Body changes during your second trimester Your body continues to go through many changes during your second trimester. The changes vary from woman to woman.  Your weight will continue to increase. You will notice your lower abdomen bulging out.  You may begin to get stretch marks on your hips, abdomen, and breasts.  You may develop headaches that can be relieved by medicines. The medicines should be approved by your health care provider.  You may urinate more often because the fetus is pressing on your bladder.  You may develop or continue to have heartburn as a result of your pregnancy.  You may develop constipation because certain hormones are causing the muscles that push waste through your intestines to slow down.  You may develop hemorrhoids or swollen, bulging veins (varicose veins).  You may have back pain. This is caused by: ? Weight gain. ? Pregnancy hormones that are relaxing the joints in your pelvis. ? A shift in weight and the muscles that support your balance.  Your breasts will continue to grow and they will continue to become tender.  Your gums may bleed  and may be sensitive to brushing and flossing.  Dark spots or blotches (chloasma, mask of pregnancy) may develop on your face. This will likely fade after the baby is born.  A dark line from your belly button to the pubic area (linea nigra) may appear. This will likely fade after the baby is born.  You may have changes in your hair. These can include thickening of your hair, rapid growth, and changes in texture. Some women also have hair loss during or after pregnancy, or hair that feels dry or thin. Your hair will most likely return to normal after your baby is born.  What to expect at prenatal visits During a routine prenatal visit:  You will be weighed to make sure you and the fetus are growing normally.  Your blood pressure will be taken.  Your abdomen will be measured to track your baby's growth.  The fetal heartbeat will be listened to.  Any test results from the previous visit will be discussed.  Your health care provider may ask you:  How you are feeling.  If you are feeling the baby move.  If you have had any abnormal symptoms, such as leaking fluid, bleeding, severe headaches, or abdominal cramping.  If you are using any tobacco products, including cigarettes, chewing tobacco, and electronic cigarettes.  If you have any questions.  Other tests that may be performed during your second trimester include:  Blood tests that check for: ? Low iron levels (anemia). ? High  blood sugar that affects pregnant women (gestational diabetes) between 3 and 28 weeks. ? Rh antibodies. This is to check for a protein on red blood cells (Rh factor).  Urine tests to check for infections, diabetes, or protein in the urine.  An ultrasound to confirm the proper growth and development of the baby.  An amniocentesis to check for possible genetic problems.  Fetal screens for spina bifida and Down syndrome.  HIV (human immunodeficiency virus) testing. Routine prenatal testing includes  screening for HIV, unless you choose not to have this test.  Follow these instructions at home: Medicines  Follow your health care provider's instructions regarding medicine use. Specific medicines may be either safe or unsafe to take during pregnancy.  Take a prenatal vitamin that contains at least 600 micrograms (mcg) of folic acid.  If you develop constipation, try taking a stool softener if your health care provider approves. Eating and drinking  Eat a balanced diet that includes fresh fruits and vegetables, whole grains, good sources of protein such as meat, eggs, or tofu, and low-fat dairy. Your health care provider will help you determine the amount of weight gain that is right for you.  Avoid raw meat and uncooked cheese. These carry germs that can cause birth defects in the baby.  If you have low calcium intake from food, talk to your health care provider about whether you should take a daily calcium supplement.  Limit foods that are high in fat and processed sugars, such as fried and sweet foods.  To prevent constipation: ? Drink enough fluid to keep your urine clear or pale yellow. ? Eat foods that are high in fiber, such as fresh fruits and vegetables, whole grains, and beans. Activity  Exercise only as directed by your health care provider. Most women can continue their usual exercise routine during pregnancy. Try to exercise for 30 minutes at least 5 days a week. Stop exercising if you experience uterine contractions.  Avoid heavy lifting, wear low heel shoes, and practice good posture.  A sexual relationship may be continued unless your health care provider directs you otherwise. Relieving pain and discomfort  Wear a good support bra to prevent discomfort from breast tenderness.  Take warm sitz baths to soothe any pain or discomfort caused by hemorrhoids. Use hemorrhoid cream if your health care provider approves.  Rest with your legs elevated if you have leg cramps  or low back pain.  If you develop varicose veins, wear support hose. Elevate your feet for 15 minutes, 3-4 times a day. Limit salt in your diet. Prenatal Care  Write down your questions. Take them to your prenatal visits.  Keep all your prenatal visits as told by your health care provider. This is important. Safety  Wear your seat belt at all times when driving.  Make a list of emergency phone numbers, including numbers for family, friends, the hospital, and police and fire departments. General instructions  Ask your health care provider for a referral to a local prenatal education class. Begin classes no later than the beginning of month 6 of your pregnancy.  Ask for help if you have counseling or nutritional needs during pregnancy. Your health care provider can offer advice or refer you to specialists for help with various needs.  Do not use hot tubs, steam rooms, or saunas.  Do not douche or use tampons or scented sanitary pads.  Do not cross your legs for long periods of time.  Avoid cat litter boxes and soil  used by cats. These carry germs that can cause birth defects in the baby and possibly loss of the fetus by miscarriage or stillbirth.  Avoid all smoking, herbs, alcohol, and unprescribed drugs. Chemicals in these products can affect the formation and growth of the baby.  Do not use any products that contain nicotine or tobacco, such as cigarettes and e-cigarettes. If you need help quitting, ask your health care provider.  Visit your dentist if you have not gone yet during your pregnancy. Use a soft toothbrush to brush your teeth and be gentle when you floss. Contact a health care provider if:  You have dizziness.  You have mild pelvic cramps, pelvic pressure, or nagging pain in the abdominal area.  You have persistent nausea, vomiting, or diarrhea.  You have a bad smelling vaginal discharge.  You have pain when you urinate. Get help right away if:  You have a  fever.  You are leaking fluid from your vagina.  You have spotting or bleeding from your vagina.  You have severe abdominal cramping or pain.  You have rapid weight gain or weight loss.  You have shortness of breath with chest pain.  You notice sudden or extreme swelling of your face, hands, ankles, feet, or legs.  You have not felt your baby move in over an hour.  You have severe headaches that do not go away when you take medicine.  You have vision changes. Summary  The second trimester is from week 14 through week 27 (months 4 through 6). It is also a time when the fetus is growing rapidly.  Your body goes through many changes during pregnancy. The changes vary from woman to woman.  Avoid all smoking, herbs, alcohol, and unprescribed drugs. These chemicals affect the formation and growth your baby.  Do not use any tobacco products, such as cigarettes, chewing tobacco, and e-cigarettes. If you need help quitting, ask your health care provider.  Contact your health care provider if you have any questions. Keep all prenatal visits as told by your health care provider. This is important. This information is not intended to replace advice given to you by your health care provider. Make sure you discuss any questions you have with your health care provider. Document Released: 09/03/2001 Document Revised: 02/15/2016 Document Reviewed: 11/10/2012 Elsevier Interactive Patient Education  2017 [employer].

## 2018-02-02 NOTE — Progress Notes (Signed)
Work-in LOW-RISK PREGNANCY VISIT Patient name: Chelsea Strong MRN [id number]  Date of birth: 1991/03/07 Chief Complaint:   work in ob (discharge, bleeding after sex)  History of Present Illness:   Chelsea Strong is a 27 y.o. [phone] female at [redacted]w[redacted]d with an Estimated Date of Delivery: 07/21/18 being seen today for ongoing management of a low-risk pregnancy.  Today she reports bright red vaginal bleeding yesterday after sex, small amt when wiping. Some upper abd cramping. No lower abd pain/cramping. .  .  .  Movement: Absent. denies leaking of fluid. Review of Systems:   Pertinent items are noted in HPI Denies abnormal vaginal discharge w/ itching/odor/irritation, headaches, visual changes, shortness of breath, chest pain, abdominal pain, severe nausea/vomiting, or problems with urination or bowel movements unless otherwise stated above. Pertinent History Reviewed:  Reviewed past medical,surgical, social, obstetrical and family history.  Reviewed problem list, medications and allergies. Physical Assessment:   Vitals:   02/02/18 1630  BP: 110/60  Pulse: 98  Weight: 145 lb (65.8 kg)  Body mass index is 27.4 kg/m.        Physical Examination:   General appearance: Well appearing, and in no distress  Mental status: Alert, oriented to person, place, and time  Skin: Warm & dry  Cardiovascular: Normal heart rate noted  Respiratory: Normal respiratory effort, no distress  Abdomen: Soft, gravid, nontender  Pelvic: spec exam: cx visually closed, no active bleeding, small amt light brown nonodorous d/c         Extremities: Edema: None  Fetal Status: Fetal Heart Rate (bpm): 156   Movement: Absent    Results for orders placed or performed in visit on 02/02/18 (from the past 24 hour(s))  POCT urinalysis dipstick   Collection Time: 02/02/18  4:38 PM  Result Value Ref Range   Color, UA     Clarity, UA     Glucose, UA neg    Bilirubin, UA     Ketones, UA neg    Spec Grav, UA  1.010 -  1.025   Blood, UA neg    pH, UA  5.0 - 8.0   Protein, UA trace    Urobilinogen, UA  0.2 or 1.0 E.U./dL   Nitrite, UA neg    Leukocytes, UA Negative Negative   Appearance     Odor    POCT Wet Prep [facility])   Collection Time: 02/02/18  5:02 PM  Result Value Ref Range   Source Wet Prep POC vaginal    WBC, Wet Prep HPF POC none    Bacteria Wet Prep HPF POC Few Few   BACTERIA WET PREP MORPHOLOGY POC     Clue Cells Wet Prep HPF POC None None   Clue Cells Wet Prep Whiff POC Negative Whiff    Yeast Wet Prep HPF POC None    KOH Wet Prep POC     Trichomonas Wet Prep HPF POC Absent Absent    Assessment & Plan:  1) Low-risk pregnancy [id number] at [redacted]w[redacted]d with an Estimated Date of Delivery: 07/21/18   2) Postcoital bleeding, resolved, Rh+, will send gc/ct. Pelvic rest x 7d   Meds: No orders of the defined types were placed in this encounter.  Labs/procedures today: wet prep, gc/ct  Plan:  Continue routine obstetrical care   Reviewed: Preterm labor symptoms and general obstetric precautions including but not limited to vaginal bleeding, contractions, leaking of fluid and fetal movement were reviewed in detail with the patient.  All questions were answered  Follow-up: Return for as scheduled.  Orders Placed This Encounter  Procedures  . GC/Chlamydia Probe Amp  . POCT urinalysis dipstick  . POCT Wet Prep [facility])   Roma Schanz CNM, [facility] 02/02/2018 5:03 PM

## 2018-02-02 NOTE — Telephone Encounter (Signed)
Patient states she woke up yesterday and noticed blood in her bed.  Now she is experiencing light pink spotting. Did have some mild cramping yesterday. States she did notice the bleeding after intercourse.  Also has a discharge but without odor.  Will w/i today for eval.

## 2018-02-06 LAB — GC/CHLAMYDIA PROBE AMP
Chlamydia trachomatis, NAA: NEGATIVE
Neisseria gonorrhoeae by PCR: NEGATIVE

## 2018-02-19 ENCOUNTER — Ambulatory Visit (INDEPENDENT_AMBULATORY_CARE_PROVIDER_SITE_OTHER): Payer: BLUE CROSS/BLUE SHIELD | Admitting: Women's Health

## 2018-02-19 ENCOUNTER — Encounter: Payer: Self-pay | Admitting: Women's Health

## 2018-02-19 ENCOUNTER — Ambulatory Visit (INDEPENDENT_AMBULATORY_CARE_PROVIDER_SITE_OTHER): Payer: BLUE CROSS/BLUE SHIELD

## 2018-02-19 VITALS — BP 108/70 | HR 90 | Wt 147.6 lb

## 2018-02-19 DIAGNOSIS — Z363 Encounter for antenatal screening for malformations: Secondary | ICD-10-CM

## 2018-02-19 DIAGNOSIS — Z3482 Encounter for supervision of other normal pregnancy, second trimester: Secondary | ICD-10-CM

## 2018-02-19 DIAGNOSIS — Z3A18 18 weeks gestation of pregnancy: Secondary | ICD-10-CM

## 2018-02-19 DIAGNOSIS — Z1379 Encounter for other screening for genetic and chromosomal anomalies: Secondary | ICD-10-CM

## 2018-02-19 DIAGNOSIS — Z3402 Encounter for supervision of normal first pregnancy, second trimester: Secondary | ICD-10-CM

## 2018-02-19 DIAGNOSIS — Z331 Pregnant state, incidental: Secondary | ICD-10-CM

## 2018-02-19 DIAGNOSIS — Z1389 Encounter for screening for other disorder: Secondary | ICD-10-CM

## 2018-02-19 LAB — POCT URINALYSIS DIPSTICK
Blood, UA: NEGATIVE
Glucose, UA: NEGATIVE
Ketones, UA: NEGATIVE
LEUKOCYTES UA: NEGATIVE
Nitrite, UA: NEGATIVE
Protein, UA: POSITIVE — AB

## 2018-02-19 NOTE — Progress Notes (Signed)
   LOW-RISK PREGNANCY VISIT Patient name: Chelsea Strong MRN [id number]  Date of birth: [birth year] Chief Complaint:   Routine Prenatal Visit ([place] today)  History of Present Illness:   Chelsea Strong is a 27 y.o. [id number] female at [redacted]w[redacted]d with an Estimated Date of Delivery: 07/21/18 being seen today for ongoing management of a low-risk pregnancy.  Today she reports no complaints. Doing well checking bp's weekly at home on [facility], no problems  . Vag. Bleeding: None.  Movement: Absent. denies leaking of fluid. Review of Systems:   Pertinent items are noted in HPI Denies abnormal vaginal discharge w/ itching/odor/irritation, headaches, visual changes, shortness of breath, chest pain, abdominal pain, severe nausea/vomiting, or problems with urination or bowel movements unless otherwise stated above. Pertinent History Reviewed:  Reviewed past medical,surgical, social, obstetrical and family history.  Reviewed problem list, medications and allergies. Physical Assessment:   Vitals:   02/19/18 0941  BP: 108/70  Pulse: 90  Weight: 147 lb 9.6 oz (67 kg)  Body mass index is 27.89 kg/m.        Physical Examination:   General appearance: Well appearing, and in no distress  Mental status: Alert, oriented to person, place, and time  Skin: Warm & dry  Cardiovascular: Normal heart rate noted  Respiratory: Normal respiratory effort, no distress  Abdomen: Soft, gravid, nontender  Pelvic: Cervical exam deferred         Extremities: Edema: None  Fetal Status: Fetal Heart Rate (bpm): 150 u/s   Movement: Absent    [place] 93+7 wks,cephalic,fundal placenta gr 0,normal ovaries bilat,fhr 150 bpm,cx 3.3 cm,svp of fluid 3.4 cm,EFW 212 g 21%,anatomy complete,no obvious abnormalities   Results for orders placed or performed in visit on 02/19/18 (from the past 24 hour(s))  POCT Urinalysis Dipstick   Collection Time: 02/19/18  9:46 AM  Result Value Ref Range   Color, UA     Clarity, UA     Glucose, UA  Negative Negative   Bilirubin, UA     Ketones, UA neg    Spec Grav, UA  1.010 - 1.025   Blood, UA neg    pH, UA  5.0 - 8.0   Protein, UA Positive (A) Negative   Urobilinogen, UA  0.2 or 1.0 E.U./dL   Nitrite, UA neg    Leukocytes, UA Negative Negative   Appearance     Odor      Assessment & Plan:  1) Low-risk pregnancy [id number] at [redacted]w[redacted]d with an Estimated Date of Delivery: 07/21/18    Meds: No orders of the defined types were placed in this encounter.  Labs/procedures today: anatomy u/s, 2nd IT  Plan:  Continue routine obstetrical care   Reviewed: Preterm labor symptoms and general obstetric precautions including but not limited to vaginal bleeding, contractions, leaking of fluid and fetal movement were reviewed in detail with the patient.  All questions were answered  Follow-up: Return in about 8 weeks (around 04/16/2018) for LROB, PN2.  (optimized pt)  Orders Placed This Encounter  Procedures  . INTEGRATED 2  . POCT Urinalysis Dipstick   Roma Schanz CNM, WHNP-BC 02/19/2018 10:14 AM

## 2018-02-19 NOTE — Progress Notes (Signed)
[place] 59+7 wks,cephalic,fundal placenta gr 0,normal ovaries bilat,fhr 150 bpm,cx 3.3 cm,svp of fluid 3.4 cm,EFW 212 g 21%,anatomy complete,no obvious abnormalities

## 2018-02-19 NOTE — Patient Instructions (Signed)
Chelsea Strong, I greatly value your feedback.  If you receive a survey following your visit with [place] today, we appreciate you taking the time to fill it out.  Thanks, [name], CNM, WHNP-BC   You will have your sugar test next visit.  Please do not eat or drink anything after midnight the night before you come, not even water.  You will be here for at least two hours.     Call the office [phone]) or go to [facility] if:  You begin to have strong, frequent contractions  Your water breaks.  Sometimes it is a big gush of fluid, sometimes it is just a trickle that keeps getting your panties wet or running down your legs  You have vaginal bleeding.  It is normal to have a small amount of spotting if your cervix was checked.   After 22wks: You don't feel your baby moving like normal.  If you don't, get you something to eat and drink and lay down and focus on feeling your baby move.   If your baby is still not moving like normal, you should call the office or go to [place] of Pregnancy The second trimester is from week 13 through week 28, months 4 through 6. The second trimester is often a time when you feel your best. Your body has also adjusted to being pregnant, and you begin to feel better physically. Usually, morning sickness has lessened or quit completely, you may have more energy, and you may have an increase in appetite. The second trimester is also a time when the fetus is growing rapidly. At the end of the sixth month, the fetus is about 9 inches long and weighs about 1 pounds. You will likely begin to feel the baby move (quickening) between 18 and 20 weeks of the pregnancy. BODY CHANGES Your body goes through many changes during pregnancy. The changes vary from woman to woman.   Your weight will continue to increase. You will notice your lower abdomen bulging out.  You may begin to get stretch marks on your hips, abdomen, and breasts.  You may  develop headaches that can be relieved by medicines approved by your health care provider.  You may urinate more often because the fetus is pressing on your bladder.  You may develop or continue to have heartburn as a result of your pregnancy.  You may develop constipation because certain hormones are causing the muscles that push waste through your intestines to slow down.  You may develop hemorrhoids or swollen, bulging veins (varicose veins).  You may have back pain because of the weight gain and pregnancy hormones relaxing your joints between the bones in your pelvis and as a result of a shift in weight and the muscles that support your balance.  Your breasts will continue to grow and be tender.  Your gums may bleed and may be sensitive to brushing and flossing.  Dark spots or blotches (chloasma, mask of pregnancy) may develop on your face. This will likely fade after the baby is born.  A dark line from your belly button to the pubic area (linea nigra) may appear. This will likely fade after the baby is born.  You may have changes in your hair. These can include thickening of your hair, rapid growth, and changes in texture. Some women also have hair loss during or after pregnancy, or hair that feels dry or thin. Your hair will most likely return to normal  after your baby is born. WHAT TO EXPECT AT YOUR PRENATAL VISITS During a routine prenatal visit:  You will be weighed to make sure you and the fetus are growing normally.  Your blood pressure will be taken.  Your abdomen will be measured to track your baby's growth.  The fetal heartbeat will be listened to.  Any test results from the previous visit will be discussed. Your health care provider may ask you:  How you are feeling.  If you are feeling the baby move.  If you have had any abnormal symptoms, such as leaking fluid, bleeding, severe headaches, or abdominal cramping.  If you have any questions. Other tests that  may be performed during your second trimester include:  Blood tests that check for:  Low iron levels (anemia).  Gestational diabetes (between 24 and 28 weeks).  Rh antibodies.  Urine tests to check for infections, diabetes, or protein in the urine.  An ultrasound to confirm the proper growth and development of the baby.  An amniocentesis to check for possible genetic problems.  Fetal screens for spina bifida and Down syndrome. HOME CARE INSTRUCTIONS   Avoid all smoking, herbs, alcohol, and unprescribed drugs. These chemicals affect the formation and growth of the baby.  Follow your health care provider's instructions regarding medicine use. There are medicines that are either safe or unsafe to take during pregnancy.  Exercise only as directed by your health care provider. Experiencing uterine cramps is a good sign to stop exercising.  Continue to eat regular, healthy meals.  Wear a good support bra for breast tenderness.  Do not use hot tubs, steam rooms, or saunas.  Wear your seat belt at all times when driving.  Avoid raw meat, uncooked cheese, cat litter boxes, and soil used by cats. These carry germs that can cause birth defects in the baby.  Take your prenatal vitamins.  Try taking a stool softener (if your health care provider approves) if you develop constipation. Eat more high-fiber foods, such as fresh vegetables or fruit and whole grains. Drink plenty of fluids to keep your urine clear or pale yellow.  Take warm sitz baths to soothe any pain or discomfort caused by hemorrhoids. Use hemorrhoid cream if your health care provider approves.  If you develop varicose veins, wear support hose. Elevate your feet for 15 minutes, 3-4 times a day. Limit salt in your diet.  Avoid heavy lifting, wear low heel shoes, and practice good posture.  Rest with your legs elevated if you have leg cramps or low back pain.  Visit your dentist if you have not gone yet during your  pregnancy. Use a soft toothbrush to brush your teeth and be gentle when you floss.  A sexual relationship may be continued unless your health care provider directs you otherwise.  Continue to go to all your prenatal visits as directed by your health care provider. SEEK MEDICAL CARE IF:   You have dizziness.  You have mild pelvic cramps, pelvic pressure, or nagging pain in the abdominal area.  You have persistent nausea, vomiting, or diarrhea.  You have a bad smelling vaginal discharge.  You have pain with urination. SEEK IMMEDIATE MEDICAL CARE IF:   You have a fever.  You are leaking fluid from your vagina.  You have spotting or bleeding from your vagina.  You have severe abdominal cramping or pain.  You have rapid weight gain or loss.  You have shortness of breath with chest pain.  You notice sudden  or extreme swelling of your face, hands, ankles, feet, or legs.  You have not felt your baby move in over an hour.  You have severe headaches that do not go away with medicine.  You have vision changes. Document Released: 09/03/2001 Document Revised: 09/14/2013 Document Reviewed: 11/10/2012 [facility] Patient Information 2015 [place], [place]. This information is not intended to replace advice given to you by your health care provider. Make sure you discuss any questions you have with your health care provider.

## 2018-02-24 LAB — INTEGRATED 2
AFP MoM: 1.03
Alpha-Fetoprotein: 46.1 ng/mL
CROWN RUMP LENGTH: 54 mm
DIA MOM: 0.97
DIA VALUE: 173.4 pg/mL
Estriol, Unconjugated: 1.49 ng/mL
GESTATIONAL AGE: 17.9 wk
Gest. Age on Collection Date: 11.9 weeks
HCG MOM: 1.91
MATERNAL AGE AT EDD: 26.8 a
NUCHAL TRANSLUCENCY MOM: 0.91
Nuchal Translucency (NT): 1.1 mm
Number of Fetuses: 1
PAPP-A MOM: 0.91
PAPP-A Value: 711.6 ng/mL
Test Results:: NEGATIVE
WEIGHT: 141 [lb_av]
Weight: 141 [lb_av]
hCG Value: 52.2 IU/mL
uE3 MoM: 1.2

## 2018-03-11 ENCOUNTER — Telehealth: Payer: Self-pay | Admitting: *Deleted

## 2018-03-11 NOTE — Telephone Encounter (Signed)
LMOVM reminding patient to check her bp weekly and if she continues to not check, will be removed from [facility].

## 2018-03-24 ENCOUNTER — Other Ambulatory Visit: Payer: Self-pay | Admitting: Advanced Practice Midwife

## 2018-03-24 ENCOUNTER — Encounter: Payer: Self-pay | Admitting: Adult Health

## 2018-03-24 MED ORDER — CITRANATAL RX 27-1 MG PO TABS: ORAL_TABLET | ORAL | 12 refills | Status: DC

## 2018-03-25 ENCOUNTER — Encounter: Payer: Self-pay | Admitting: *Deleted

## 2018-04-16 ENCOUNTER — Encounter: Payer: Self-pay | Admitting: Advanced Practice Midwife

## 2018-04-16 ENCOUNTER — Encounter: Payer: Self-pay | Admitting: Obstetrics & Gynecology

## 2018-04-16 ENCOUNTER — Other Ambulatory Visit: Payer: BLUE CROSS/BLUE SHIELD

## 2018-04-16 ENCOUNTER — Ambulatory Visit (INDEPENDENT_AMBULATORY_CARE_PROVIDER_SITE_OTHER): Payer: BLUE CROSS/BLUE SHIELD | Admitting: Advanced Practice Midwife

## 2018-04-16 ENCOUNTER — Other Ambulatory Visit: Payer: Self-pay

## 2018-04-16 VITALS — BP 107/70 | HR 69 | Wt 157.0 lb

## 2018-04-16 DIAGNOSIS — Z131 Encounter for screening for diabetes mellitus: Secondary | ICD-10-CM

## 2018-04-16 DIAGNOSIS — Z331 Pregnant state, incidental: Secondary | ICD-10-CM

## 2018-04-16 DIAGNOSIS — Z3482 Encounter for supervision of other normal pregnancy, second trimester: Secondary | ICD-10-CM

## 2018-04-16 DIAGNOSIS — Z1389 Encounter for screening for other disorder: Secondary | ICD-10-CM

## 2018-04-16 DIAGNOSIS — Z3A26 26 weeks gestation of pregnancy: Secondary | ICD-10-CM

## 2018-04-16 LAB — POCT URINALYSIS DIPSTICK OB
Blood, UA: NEGATIVE
Glucose, UA: NEGATIVE — AB
KETONES UA: NEGATIVE
Leukocytes, UA: NEGATIVE
NITRITE UA: NEGATIVE
PROTEIN: NEGATIVE

## 2018-04-16 NOTE — Progress Notes (Signed)
  [id number] [redacted]w[redacted]d Estimated Date of Delivery: 07/21/18  Blood pressure 107/70, pulse 69, weight 157 lb (71.2 kg), last menstrual period 10/14/2017.   BP weight and urine results all reviewed and noted. BPs in babyscripts reviewed and normal  Please refer to the obstetrical flow sheet for the fundal height and fetal heart rate documentation:  Patient reports good fetal movement, denies any bleeding and no rupture of membranes symptoms or regular contractions. Patient is without complaints. All questions were answered.   Physical Assessment:   Vitals:   04/16/18 0958  BP: 107/70  Pulse: 69  Weight: 157 lb (71.2 kg)  Body mass index is 29.66 kg/m.        Physical Examination:   General appearance: Well appearing, and in no distress  Mental status: Alert, oriented to person, place, and time  Skin: Warm & dry  Cardiovascular: Normal heart rate noted  Respiratory: Normal respiratory effort, no distress  Abdomen: Soft, gravid, nontender  Pelvic: Cervical exam deferred         Extremities: Edema: None  Fetal Status:     Movement: Present    Results for orders placed or performed in visit on 04/16/18 (from the past 24 hour(s))  POC Urinalysis Dipstick OB   Collection Time: 04/16/18 10:05 AM  Result Value Ref Range   Color, UA     Clarity, UA     Glucose, UA Negative (A) (none)   Bilirubin, UA     Ketones, UA neg    Spec Grav, UA  1.010 - 1.025   Blood, UA neg    pH, UA  5.0 - 8.0   POC Protein UA Negative Negative, Trace   Urobilinogen, UA  0.2 or 1.0 E.U./dL   Nitrite, UA neg    Leukocytes, UA Negative Negative   Appearance     Odor       Orders Placed This Encounter  Procedures  . POC Urinalysis Dipstick OB    Plan:  Continued routine obstetrical care, PN2 today  Return in about 4 weeks (around 05/12/2018) for LROB.

## 2018-04-16 NOTE — Patient Instructions (Signed)
Chelsea Strong, I greatly value your feedback.  If you receive a survey following your visit with [place] today, we appreciate you taking the time to fill it out.  Thanks, Nigel Berthold, CNM   Call the office [phone]) or go to [facility] if:  You begin to have strong, frequent contractions  Your water breaks.  Sometimes it is a big gush of fluid, sometimes it is just a trickle that keeps getting your panties wet or running down your legs  You have vaginal bleeding.  It is normal to have a small amount of spotting if your cervix was checked.   You don't feel your baby moving like normal.  If you don't, get you something to eat and drink and lay down and focus on feeling your baby move.  You should feel at least 10 movements in 2 hours.  If you don't, you should call the office or go to [facility].    Tdap Vaccine  It is recommended that you get the Tdap vaccine during the third trimester of EACH pregnancy to help protect your baby from getting pertussis (whooping cough)  27-36 weeks is the BEST time to do this so that you can pass the protection on to your baby. During pregnancy is better than after pregnancy, but if you are unable to get it during pregnancy it will be offered at the hospital.   You will be offered this vaccine in the office after 27 weeks. If you do not have health insurance, you can get this vaccine at the health department or your family doctor  Everyone who will be around your baby should also be up-to-date on their vaccines. Adults (who are not pregnant) only need 1 dose of Tdap during adulthood.   Third Trimester of Pregnancy The third trimester is from week 29 through week 42, months 7 through 9. The third trimester is a time when the fetus is growing rapidly. At the end of the ninth month, the fetus is about 20 inches in length and weighs 6-10 pounds.  BODY CHANGES Your body goes through many changes during pregnancy. The changes vary from woman  to woman.   Your weight will continue to increase. You can expect to gain 25-35 pounds (11-16 kg) by the end of the pregnancy.  You may begin to get stretch marks on your hips, abdomen, and breasts.  You may urinate more often because the fetus is moving lower into your pelvis and pressing on your bladder.  You may develop or continue to have heartburn as a result of your pregnancy.  You may develop constipation because certain hormones are causing the muscles that push waste through your intestines to slow down.  You may develop hemorrhoids or swollen, bulging veins (varicose veins).  You may have pelvic pain because of the weight gain and pregnancy hormones relaxing your joints between the bones in your pelvis. Backaches may result from overexertion of the muscles supporting your posture.  You may have changes in your hair. These can include thickening of your hair, rapid growth, and changes in texture. Some women also have hair loss during or after pregnancy, or hair that feels dry or thin. Your hair will most likely return to normal after your baby is born.  Your breasts will continue to grow and be tender. A yellow discharge may leak from your breasts called colostrum.  Your belly button may stick out.  You may feel short of breath because of your expanding uterus.  You may notice the fetus "dropping," or moving lower in your abdomen.  You may have a bloody mucus discharge. This usually occurs a few days to a week before labor begins.  Your cervix becomes thin and soft (effaced) near your due date. WHAT TO EXPECT AT YOUR PRENATAL EXAMS  You will have prenatal exams every 2 weeks until week 36. Then, you will have weekly prenatal exams. During a routine prenatal visit:  You will be weighed to make sure you and the fetus are growing normally.  Your blood pressure is taken.  Your abdomen will be measured to track your baby's growth.  The fetal heartbeat will be listened  to.  Any test results from the previous visit will be discussed.  You may have a cervical check near your due date to see if you have effaced. At around 36 weeks, your caregiver will check your cervix. At the same time, your caregiver will also perform a test on the secretions of the vaginal tissue. This test is to determine if a type of bacteria, Group B streptococcus, is present. Your caregiver will explain this further. Your caregiver may ask you:  What your birth plan is.  How you are feeling.  If you are feeling the baby move.  If you have had any abnormal symptoms, such as leaking fluid, bleeding, severe headaches, or abdominal cramping.  If you have any questions. Other tests or screenings that may be performed during your third trimester include:  Blood tests that check for low iron levels (anemia).  Fetal testing to check the health, activity level, and growth of the fetus. Testing is done if you have certain medical conditions or if there are problems during the pregnancy. FALSE LABOR You may feel small, irregular contractions that eventually go away. These are called Braxton Hicks contractions, or false labor. Contractions may last for hours, days, or even weeks before true labor sets in. If contractions come at regular intervals, intensify, or become painful, it is best to be seen by your caregiver.  SIGNS OF LABOR   Menstrual-like cramps.  Contractions that are 5 minutes apart or less.  Contractions that start on the top of the uterus and spread down to the lower abdomen and back.  A sense of increased pelvic pressure or back pain.  A watery or bloody mucus discharge that comes from the vagina. If you have any of these signs before the 37th week of pregnancy, call your caregiver right away. You need to go to the hospital to get checked immediately. HOME CARE INSTRUCTIONS   Avoid all smoking, herbs, alcohol, and unprescribed drugs. These chemicals affect the  formation and growth of the baby.  Follow your caregiver's instructions regarding medicine use. There are medicines that are either safe or unsafe to take during pregnancy.  Exercise only as directed by your caregiver. Experiencing uterine cramps is a good sign to stop exercising.  Continue to eat regular, healthy meals.  Wear a good support bra for breast tenderness.  Do not use hot tubs, steam rooms, or saunas.  Wear your seat belt at all times when driving.  Avoid raw meat, uncooked cheese, cat litter boxes, and soil used by cats. These carry germs that can cause birth defects in the baby.  Take your prenatal vitamins.  Try taking a stool softener (if your caregiver approves) if you develop constipation. Eat more high-fiber foods, such as fresh vegetables or fruit and whole grains. Drink plenty of fluids to keep your urine  clear or pale yellow.  Take warm sitz baths to soothe any pain or discomfort caused by hemorrhoids. Use hemorrhoid cream if your caregiver approves.  If you develop varicose veins, wear support hose. Elevate your feet for 15 minutes, 3-4 times a day. Limit salt in your diet.  Avoid heavy lifting, wear low heal shoes, and practice good posture.  Rest a lot with your legs elevated if you have leg cramps or low back pain.  Visit your dentist if you have not gone during your pregnancy. Use a soft toothbrush to brush your teeth and be gentle when you floss.  A sexual relationship may be continued unless your caregiver directs you otherwise.  Do not travel far distances unless it is absolutely necessary and only with the approval of your caregiver.  Take prenatal classes to understand, practice, and ask questions about the labor and delivery.  Make a trial run to the hospital.  Pack your hospital bag.  Prepare the baby's nursery.  Continue to go to all your prenatal visits as directed by your caregiver. SEEK MEDICAL CARE IF:  You are unsure if you are in  labor or if your water has broken.  You have dizziness.  You have mild pelvic cramps, pelvic pressure, or nagging pain in your abdominal area.  You have persistent nausea, vomiting, or diarrhea.  You have a bad smelling vaginal discharge.  You have pain with urination. SEEK IMMEDIATE MEDICAL CARE IF:   You have a fever.  You are leaking fluid from your vagina.  You have spotting or bleeding from your vagina.  You have severe abdominal cramping or pain.  You have rapid weight loss or gain.  You have shortness of breath with chest pain.  You notice sudden or extreme swelling of your face, hands, ankles, feet, or legs.  You have not felt your baby move in over an hour.  You have severe headaches that do not go away with medicine.  You have vision changes. Document Released: 09/03/2001 Document Revised: 09/14/2013 Document Reviewed: 11/10/2012 [facility] Patient Information 2015 [place], [place]. This information is not intended to replace advice given to you by your health care provider. Make sure you discuss any questions you have with your health care provider.

## 2018-04-17 LAB — RPR: RPR: NONREACTIVE

## 2018-04-17 LAB — GLUCOSE TOLERANCE, 2 HOURS W/ 1HR
GLUCOSE, 1 HOUR: 112 mg/dL (ref 65–179)
GLUCOSE, FASTING: 82 mg/dL (ref 65–91)
Glucose, 2 hour: 107 mg/dL (ref 65–152)

## 2018-04-17 LAB — HIV ANTIBODY (ROUTINE TESTING W REFLEX): HIV Screen 4th Generation wRfx: NONREACTIVE

## 2018-04-17 LAB — ANTIBODY SCREEN: Antibody Screen: NEGATIVE

## 2018-04-17 LAB — CBC
Hematocrit: 34.7 % (ref 34.0–46.6)
Hemoglobin: 11.4 g/dL (ref 11.1–15.9)
MCH: 28.8 pg (ref 26.6–33.0)
MCHC: 32.9 g/dL (ref 31.5–35.7)
MCV: 88 fL (ref 79–97)
PLATELETS: 241 10*3/uL (ref 150–450)
RBC: 3.96 x10E6/uL (ref 3.77–5.28)
RDW: 13.4 % (ref 12.3–15.4)
WBC: 8.1 10*3/uL (ref 3.4–10.8)

## 2018-04-21 ENCOUNTER — Encounter: Payer: Self-pay | Admitting: *Deleted

## 2018-05-14 ENCOUNTER — Ambulatory Visit (INDEPENDENT_AMBULATORY_CARE_PROVIDER_SITE_OTHER): Payer: BLUE CROSS/BLUE SHIELD | Admitting: Obstetrics & Gynecology

## 2018-05-14 ENCOUNTER — Encounter: Payer: Self-pay | Admitting: Obstetrics & Gynecology

## 2018-05-14 VITALS — BP 109/70 | HR 74 | Wt 163.5 lb

## 2018-05-14 DIAGNOSIS — Z23 Encounter for immunization: Secondary | ICD-10-CM | POA: Diagnosis not present

## 2018-05-14 DIAGNOSIS — Z1389 Encounter for screening for other disorder: Secondary | ICD-10-CM

## 2018-05-14 DIAGNOSIS — Z3A30 30 weeks gestation of pregnancy: Secondary | ICD-10-CM

## 2018-05-14 DIAGNOSIS — Z331 Pregnant state, incidental: Secondary | ICD-10-CM

## 2018-05-14 DIAGNOSIS — Z3483 Encounter for supervision of other normal pregnancy, third trimester: Secondary | ICD-10-CM

## 2018-05-14 LAB — POCT URINALYSIS DIPSTICK OB
Blood, UA: NEGATIVE
Glucose, UA: NEGATIVE — AB
KETONES UA: NEGATIVE
Leukocytes, UA: NEGATIVE
NITRITE UA: NEGATIVE
PROTEIN: NEGATIVE

## 2018-05-14 NOTE — Progress Notes (Signed)
   LOW-RISK PREGNANCY VISIT Patient name: Chelsea Strong MRN [id number]  Date of birth: Jan 27, 1991 Chief Complaint:   Routine Prenatal Visit  History of Present Illness:   Chelsea Strong is a 27 y.o. [id number] female at [redacted]w[redacted]d with an Estimated Date of Delivery: 07/21/18 being seen today for ongoing management of a low-risk pregnancy.  Today she reports no complaints. Contractions: Not present. Vag. Bleeding: None.  Movement: Present. denies leaking of fluid. Review of Systems:   Pertinent items are noted in HPI Denies abnormal vaginal discharge w/ itching/odor/irritation, headaches, visual changes, shortness of breath, chest pain, abdominal pain, severe nausea/vomiting, or problems with urination or bowel movements unless otherwise stated above. Pertinent History Reviewed:  Reviewed past medical,surgical, social, obstetrical and family history.  Reviewed problem list, medications and allergies. Physical Assessment:   Vitals:   05/14/18 0924  BP: 109/70  Pulse: 74  Weight: 163 lb 8 oz (74.2 kg)  Body mass index is 30.89 kg/m.        Physical Examination:   General appearance: Well appearing, and in no distress  Mental status: Alert, oriented to person, place, and time  Skin: Warm & dry  Cardiovascular: Normal heart rate noted  Respiratory: Normal respiratory effort, no distress  Abdomen: Soft, gravid, nontender  Pelvic: Cervical exam deferred         Extremities: Edema: None  Fetal Status: Fetal Heart Rate (bpm): 146 Fundal Height: 32 cm Movement: Present    Results for orders placed or performed in visit on 05/14/18 (from the past 24 hour(s))  POC Urinalysis Dipstick OB   Collection Time: 05/14/18  9:25 AM  Result Value Ref Range   Color, UA     Clarity, UA     Glucose, UA Negative (A) (none)   Bilirubin, UA     Ketones, UA neg    Spec Grav, UA     Blood, UA neg    pH, UA     POC Protein UA Negative Negative, Trace   Urobilinogen, UA     Nitrite, UA neg    Leukocytes, UA Negative Negative   Appearance     Odor      Assessment & Plan:  1) Low-risk pregnancy [id number] at [redacted]w[redacted]d with an Estimated Date of Delivery: 07/21/18      Meds: No orders of the defined types were placed in this encounter.  Labs/procedures today:   Plan:  Continue routine obstetrical care   Reviewed: Preterm labor symptoms and general obstetric precautions including but not limited to vaginal bleeding, contractions, leaking of fluid and fetal movement were reviewed in detail with the patient.  All questions were answered  Follow-up: Return in about 4 weeks (around 06/11/2018) for [place].  Orders Placed This Encounter  Procedures  . Tdap vaccine greater than or equal to 7yo IM  . POC Urinalysis Dipstick OB   Florian Buff  05/14/2018 9:42 AM

## 2018-05-18 ENCOUNTER — Encounter: Payer: Self-pay | Admitting: *Deleted

## 2018-06-11 ENCOUNTER — Encounter: Payer: Self-pay | Admitting: Advanced Practice Midwife

## 2018-06-11 ENCOUNTER — Ambulatory Visit (INDEPENDENT_AMBULATORY_CARE_PROVIDER_SITE_OTHER): Payer: BLUE CROSS/BLUE SHIELD | Admitting: Advanced Practice Midwife

## 2018-06-11 VITALS — BP 111/74 | HR 82 | Wt 167.5 lb

## 2018-06-11 DIAGNOSIS — Z3483 Encounter for supervision of other normal pregnancy, third trimester: Secondary | ICD-10-CM

## 2018-06-11 DIAGNOSIS — Z331 Pregnant state, incidental: Secondary | ICD-10-CM

## 2018-06-11 DIAGNOSIS — Z1389 Encounter for screening for other disorder: Secondary | ICD-10-CM

## 2018-06-11 DIAGNOSIS — Z3A34 34 weeks gestation of pregnancy: Secondary | ICD-10-CM

## 2018-06-11 LAB — POCT URINALYSIS DIPSTICK OB
Blood, UA: NEGATIVE
GLUCOSE, UA: NEGATIVE
KETONES UA: NEGATIVE
Leukocytes, UA: NEGATIVE
Nitrite, UA: NEGATIVE
POC,PROTEIN,UA: NEGATIVE

## 2018-06-11 NOTE — Patient Instructions (Signed)

## 2018-06-11 NOTE — Progress Notes (Signed)
  [id number] [redacted]w[redacted]d Estimated Date of Delivery: 07/21/18  Blood pressure 111/74, pulse 82, weight 167 lb 8 oz (76 kg), last menstrual period 10/14/2017.   BP weight and urine results all reviewed and noted.  Please refer to the obstetrical flow sheet for the fundal height and fetal heart rate documentation:  Patient reports good fetal movement, denies any bleeding and no rupture of membranes symptoms or regular contractions. Patient is without complaints. All questions were answered.   Physical Assessment:   Vitals:   06/11/18 0930  BP: 111/74  Pulse: 82  Weight: 167 lb 8 oz (76 kg)  Body mass index is 31.65 kg/m.        Physical Examination:   General appearance: Well appearing, and in no distress  Mental status: Alert, oriented to person, place, and time  Skin: Warm & dry  Cardiovascular: Normal heart rate noted  Respiratory: Normal respiratory effort, no distress  Abdomen: Soft, gravid, nontender  Pelvic: Cervical exam deferred         Extremities: Edema: None  Fetal Status: Fetal Heart Rate (bpm): 160 Fundal Height: 37 cm Movement: Present    Results for orders placed or performed in visit on 06/11/18 (from the past 24 hour(s))  POC Urinalysis Dipstick OB   Collection Time: 06/11/18  9:32 AM  Result Value Ref Range   Color, UA     Clarity, UA     Glucose, UA Negative Negative   Bilirubin, UA     Ketones, UA neg    Spec Grav, UA     Blood, UA neg    pH, UA     POC Protein UA Negative Negative, Trace   Urobilinogen, UA     Nitrite, UA neg    Leukocytes, UA Negative Negative   Appearance     Odor       Orders Placed This Encounter  Procedures  . POC Urinalysis Dipstick OB    Plan:  Continued routine obstetrical care, taken out of optimization d/t not checking BPs  Return in about 2 weeks (around 06/25/2018) for LROB.

## 2018-06-25 ENCOUNTER — Ambulatory Visit (INDEPENDENT_AMBULATORY_CARE_PROVIDER_SITE_OTHER): Payer: BLUE CROSS/BLUE SHIELD | Admitting: Women's Health

## 2018-06-25 ENCOUNTER — Other Ambulatory Visit: Payer: Self-pay

## 2018-06-25 ENCOUNTER — Encounter: Payer: Self-pay | Admitting: Women's Health

## 2018-06-25 ENCOUNTER — Encounter: Payer: Self-pay | Admitting: Advanced Practice Midwife

## 2018-06-25 VITALS — BP 110/68 | HR 68 | Wt 170.0 lb

## 2018-06-25 DIAGNOSIS — Z3483 Encounter for supervision of other normal pregnancy, third trimester: Secondary | ICD-10-CM

## 2018-06-25 DIAGNOSIS — Z331 Pregnant state, incidental: Secondary | ICD-10-CM

## 2018-06-25 DIAGNOSIS — Z3A36 36 weeks gestation of pregnancy: Secondary | ICD-10-CM

## 2018-06-25 DIAGNOSIS — Z1389 Encounter for screening for other disorder: Secondary | ICD-10-CM

## 2018-06-25 LAB — POCT URINALYSIS DIPSTICK OB
Blood, UA: NEGATIVE
Glucose, UA: NEGATIVE
KETONES UA: NEGATIVE
Leukocytes, UA: NEGATIVE
NITRITE UA: NEGATIVE
PROTEIN: NEGATIVE

## 2018-06-25 NOTE — Progress Notes (Signed)
   LOW-RISK PREGNANCY VISIT Patient name: Chelsea Strong MRN [id number]  Date of birth: Jan 01, 1991 Chief Complaint:   Routine Prenatal Visit  History of Present Illness:   Chelsea Strong is a 27 y.o. [id number] female at [redacted]w[redacted]d with an Estimated Date of Delivery: 07/21/18 being seen today for ongoing management of a low-risk pregnancy.  Today she reports round ligament pain last night. Contractions: Irregular. Vag. Bleeding: None.  Movement: Present. denies leaking of fluid. Review of Systems:   Pertinent items are noted in HPI Denies abnormal vaginal discharge w/ itching/odor/irritation, headaches, visual changes, shortness of breath, chest pain, abdominal pain, severe nausea/vomiting, or problems with urination or bowel movements unless otherwise stated above. Pertinent History Reviewed:  Reviewed past medical,surgical, social, obstetrical and family history.  Reviewed problem list, medications and allergies. Physical Assessment:   Vitals:   06/25/18 1124  BP: 110/68  Pulse: 68  Weight: 170 lb (77.1 kg)  Body mass index is 32.12 kg/m.        Physical Examination:   General appearance: Well appearing, and in no distress  Mental status: Alert, oriented to person, place, and time  Skin: Warm & dry  Cardiovascular: Normal heart rate noted  Respiratory: Normal respiratory effort, no distress  Abdomen: Soft, gravid, nontender  Pelvic: Cervical exam deferred         Extremities: Edema: Trace  Fetal Status: Fetal Heart Rate (bpm): 120 Fundal Height: 36 cm Movement: Present Presentation: Vertex by Leopold's  Results for orders placed or performed in visit on 06/25/18 (from the past 24 hour(s))  POC Urinalysis Dipstick OB   Collection Time: 06/25/18 11:26 AM  Result Value Ref Range   Color, UA     Clarity, UA     Glucose, UA Negative Negative   Bilirubin, UA     Ketones, UA neg    Spec Grav, UA     Blood, UA neg    pH, UA     POC Protein UA Negative Negative, Trace   Urobilinogen, UA     Nitrite, UA neg    Leukocytes, UA Negative Negative   Appearance     Odor      Assessment & Plan:  1) Low-risk pregnancy [id number] at [redacted]w[redacted]d with an Estimated Date of Delivery: 07/21/18   2) Round ligament pain, discussed   Meds: No orders of the defined types were placed in this encounter.  Labs/procedures today: none  Plan:  Continue routine obstetrical care   Reviewed: Preterm labor symptoms and general obstetric precautions including but not limited to vaginal bleeding, contractions, leaking of fluid and fetal movement were reviewed in detail with the patient.  All questions were answered  Follow-up: Return in about 1 week (around 07/02/2018) for [place].  Orders Placed This Encounter  Procedures  . POC Urinalysis Dipstick OB   Roma Schanz CNM, [facility] 06/25/2018 11:46 AM

## 2018-06-25 NOTE — Patient Instructions (Signed)
Chelsea Strong, I greatly value your feedback.  If you receive a survey following your visit with [place] today, we appreciate you taking the time to fill it out.  Thanks, Chelsea Strong, CNM, WHNP-BC   Call the office [phone]) or go to [facility] if:  You begin to have strong, frequent contractions  Your water breaks.  Sometimes it is a big gush of fluid, sometimes it is just a trickle that keeps getting your panties wet or running down your legs  You have vaginal bleeding.  It is normal to have a small amount of spotting if your cervix was checked.   You don't feel your baby moving like normal.  If you don't, get you something to eat and drink and lay down and focus on feeling your baby move.  You should feel at least 10 movements in 2 hours.  If you don't, you should call the office or go to [place] Contractions Contractions of the uterus can occur throughout pregnancy, but they are not always a sign that you are in labor. You may have practice contractions called Braxton Hicks contractions. These false labor contractions are sometimes confused with true labor. What are [place] contractions? Braxton Hicks contractions are tightening movements that occur in the muscles of the uterus before labor. Unlike true labor contractions, these contractions do not result in opening (dilation) and thinning of the cervix. Toward the end of pregnancy (32-34 weeks), Braxton Hicks contractions can happen more often and may become stronger. These contractions are sometimes difficult to tell apart from true labor because they can be very uncomfortable. You should not feel embarrassed if you go to the hospital with false labor. Sometimes, the only way to tell if you are in true labor is for your health care provider to look for changes in the cervix. The health care provider will do a physical exam and may monitor your contractions. If you are not in true labor, the exam should  show that your cervix is not dilating and your water has not broken. If there are other health problems associated with your pregnancy, it is completely safe for you to be sent home with false labor. You may continue to have Braxton Hicks contractions until you go into true labor. How to tell the difference between true labor and false labor True labor  Contractions last 30-70 seconds.  Contractions become very regular.  Discomfort is usually felt in the top of the uterus, and it spreads to the lower abdomen and low back.  Contractions do not go away with walking.  Contractions usually become more intense and increase in frequency.  The cervix dilates and gets thinner. False labor  Contractions are usually shorter and not as strong as true labor contractions.  Contractions are usually irregular.  Contractions are often felt in the front of the lower abdomen and in the groin.  Contractions may go away when you walk around or change positions while lying down.  Contractions get weaker and are shorter-lasting as time goes on.  The cervix usually does not dilate or become thin. Follow these instructions at home:  Take over-the-counter and prescription medicines only as told by your health care provider.  Keep up with your usual exercises and follow other instructions from your health care provider.  Eat and drink lightly if you think you are going into labor.  If Braxton Hicks contractions are making you uncomfortable: ? Change your position from lying  down or resting to walking, or change from walking to resting. ? Sit and rest in a tub of warm water. ? Drink enough fluid to keep your urine pale yellow. Dehydration may cause these contractions. ? Do slow and deep breathing several times an hour.  Keep all follow-up prenatal visits as told by your health care provider. This is important. Contact a health care provider if:  You have a fever.  You have continuous pain in  your abdomen. Get help right away if:  Your contractions become stronger, more regular, and closer together.  You have fluid leaking or gushing from your vagina.  You pass blood-tinged mucus (bloody show).  You have bleeding from your vagina.  You have low back pain that you never had before.  You feel your baby's head pushing down and causing pelvic pressure.  Your baby is not moving inside you as much as it used to. Summary  Contractions that occur before labor are called Braxton Hicks contractions, false labor, or practice contractions.  Braxton Hicks contractions are usually shorter, weaker, farther apart, and less regular than true labor contractions. True labor contractions usually become progressively stronger and regular and they become more frequent.  Manage discomfort from [facility] contractions by changing position, resting in a warm bath, drinking plenty of water, or practicing deep breathing. This information is not intended to replace advice given to you by your health care provider. Make sure you discuss any questions you have with your health care provider. Document Released: 01/23/2017 Document Revised: 01/23/2017 Document Reviewed: 01/23/2017 Elsevier Interactive Patient Education  2018 [employer].

## 2018-07-02 ENCOUNTER — Ambulatory Visit (INDEPENDENT_AMBULATORY_CARE_PROVIDER_SITE_OTHER): Payer: BLUE CROSS/BLUE SHIELD | Admitting: Advanced Practice Midwife

## 2018-07-02 ENCOUNTER — Encounter: Payer: Self-pay | Admitting: Advanced Practice Midwife

## 2018-07-02 VITALS — BP 115/71 | HR 82 | Wt 173.4 lb

## 2018-07-02 DIAGNOSIS — Z1389 Encounter for screening for other disorder: Secondary | ICD-10-CM

## 2018-07-02 DIAGNOSIS — Z3A37 37 weeks gestation of pregnancy: Secondary | ICD-10-CM

## 2018-07-02 DIAGNOSIS — Z331 Pregnant state, incidental: Secondary | ICD-10-CM

## 2018-07-02 DIAGNOSIS — Z3483 Encounter for supervision of other normal pregnancy, third trimester: Secondary | ICD-10-CM

## 2018-07-02 LAB — POCT URINALYSIS DIPSTICK OB
GLUCOSE, UA: NEGATIVE
Ketones, UA: NEGATIVE
LEUKOCYTES UA: NEGATIVE
NITRITE UA: NEGATIVE
POC,PROTEIN,UA: NEGATIVE
RBC UA: NEGATIVE

## 2018-07-02 NOTE — Patient Instructions (Signed)

## 2018-07-02 NOTE — Progress Notes (Signed)
  [id number] [redacted]w[redacted]d Estimated Date of Delivery: 07/21/18  Blood pressure 115/71, pulse 82, weight 173 lb 6.4 oz (78.7 kg), last menstrual period 10/14/2017.   BP weight and urine results all reviewed and noted.  Please refer to the obstetrical flow sheet for the fundal height and fetal heart rate documentation:  Patient reports good fetal movement, denies any bleeding and no rupture of membranes symptoms or regular contractions. Patient is without complaints. All questions were answered.   Physical Assessment:   Vitals:   07/02/18 1627  BP: 115/71  Pulse: 82  Weight: 173 lb 6.4 oz (78.7 kg)  Body mass index is 32.76 kg/m.        Physical Examination:   General appearance: Well appearing, and in no distress  Mental status: Alert, oriented to person, place, and time  Skin: Warm & dry  Cardiovascular: Normal heart rate noted  Respiratory: Normal respiratory effort, no distress  Abdomen: Soft, gravid, nontender  Pelvic: Cervical exam performed  Dilation: 3.5 Effacement (%): 50 Station: -2  Extremities: Edema: None  Fetal Status: Fetal Heart Rate (bpm): 145 Fundal Height: 37 cm Movement: Present Presentation: Vertex  Results for orders placed or performed in visit on 07/02/18 (from the past 24 hour(s))  POC Urinalysis Dipstick OB   Collection Time: 07/02/18  4:30 PM  Result Value Ref Range   Color, UA     Clarity, UA     Glucose, UA Negative Negative   Bilirubin, UA     Ketones, UA neg    Spec Grav, UA     Blood, UA neg    pH, UA     POC Protein UA Negative Negative, Trace   Urobilinogen, UA     Nitrite, UA neg    Leukocytes, UA Negative Negative   Appearance     Odor       Orders Placed This Encounter  Procedures  . Strep Gp B NAA  . GC/Chlamydia Probe Amp  . POC Urinalysis Dipstick OB    Plan:  Continued routine obstetrical care,   Return in about 1 week (around 07/09/2018) for LROB.

## 2018-07-04 LAB — GC/CHLAMYDIA PROBE AMP
CHLAMYDIA, DNA PROBE: NEGATIVE
NEISSERIA GONORRHOEAE BY PCR: NEGATIVE

## 2018-07-04 LAB — STREP GP B NAA: Strep Gp B NAA: NEGATIVE

## 2018-07-07 ENCOUNTER — Telehealth: Payer: Self-pay | Admitting: *Deleted

## 2018-07-07 NOTE — Telephone Encounter (Signed)
Pt called with complaints of diarrhea for 3 days. BM last night appeared to have blood in it. No vomiting. Feels weak. Braxton Hicks contractions yesterday am. No fever. + baby movement. Don't feel dehydrated, urinating like normal. Advised to drink plenty of water. Can take Imodium to help with diarrhea. Keep appt for Thursday and call sooner if problems or go to [facility]. Pt voiced understanding. Wallace

## 2018-07-09 ENCOUNTER — Encounter: Payer: Self-pay | Admitting: Women's Health

## 2018-07-09 ENCOUNTER — Ambulatory Visit (INDEPENDENT_AMBULATORY_CARE_PROVIDER_SITE_OTHER): Payer: BLUE CROSS/BLUE SHIELD | Admitting: Women's Health

## 2018-07-09 VITALS — BP 110/68 | HR 61 | Wt 171.6 lb

## 2018-07-09 DIAGNOSIS — Z3483 Encounter for supervision of other normal pregnancy, third trimester: Secondary | ICD-10-CM

## 2018-07-09 DIAGNOSIS — Z331 Pregnant state, incidental: Secondary | ICD-10-CM

## 2018-07-09 DIAGNOSIS — Z3A38 38 weeks gestation of pregnancy: Secondary | ICD-10-CM

## 2018-07-09 DIAGNOSIS — Z1389 Encounter for screening for other disorder: Secondary | ICD-10-CM

## 2018-07-09 LAB — POCT URINALYSIS DIPSTICK OB
GLUCOSE, UA: NEGATIVE
Ketones, UA: NEGATIVE
LEUKOCYTES UA: NEGATIVE
Nitrite, UA: NEGATIVE
POC,PROTEIN,UA: NEGATIVE
RBC UA: NEGATIVE

## 2018-07-09 NOTE — Patient Instructions (Signed)
Chelsea Strong, I greatly value your feedback.  If you receive a survey following your visit with [place] today, we appreciate you taking the time to fill it out.  Thanks, [name], CNM, WHNP-BC   Call the office [phone]) or go to [facility] if:  You begin to have strong, frequent contractions  Your water breaks.  Sometimes it is a big gush of fluid, sometimes it is just a trickle that keeps getting your panties wet or running down your legs  You have vaginal bleeding.  It is normal to have a small amount of spotting if your cervix was checked.   You don't feel your baby moving like normal.  If you don't, get you something to eat and drink and lay down and focus on feeling your baby move.  You should feel at least 10 movements in 2 hours.  If you don't, you should call the office or go to [place] Contractions Contractions of the uterus can occur throughout pregnancy, but they are not always a sign that you are in labor. You may have practice contractions called Braxton Hicks contractions. These false labor contractions are sometimes confused with true labor. What are [place] contractions? Braxton Hicks contractions are tightening movements that occur in the muscles of the uterus before labor. Unlike true labor contractions, these contractions do not result in opening (dilation) and thinning of the cervix. Toward the end of pregnancy (32-34 weeks), Braxton Hicks contractions can happen more often and may become stronger. These contractions are sometimes difficult to tell apart from true labor because they can be very uncomfortable. You should not feel embarrassed if you go to the hospital with false labor. Sometimes, the only way to tell if you are in true labor is for your health care provider to look for changes in the cervix. The health care provider will do a physical exam and may monitor your contractions. If you are not in true labor, the exam should  show that your cervix is not dilating and your water has not broken. If there are other health problems associated with your pregnancy, it is completely safe for you to be sent home with false labor. You may continue to have Braxton Hicks contractions until you go into true labor. How to tell the difference between true labor and false labor True labor  Contractions last 30-70 seconds.  Contractions become very regular.  Discomfort is usually felt in the top of the uterus, and it spreads to the lower abdomen and low back.  Contractions do not go away with walking.  Contractions usually become more intense and increase in frequency.  The cervix dilates and gets thinner. False labor  Contractions are usually shorter and not as strong as true labor contractions.  Contractions are usually irregular.  Contractions are often felt in the front of the lower abdomen and in the groin.  Contractions may go away when you walk around or change positions while lying down.  Contractions get weaker and are shorter-lasting as time goes on.  The cervix usually does not dilate or become thin. Follow these instructions at home:  Take over-the-counter and prescription medicines only as told by your health care provider.  Keep up with your usual exercises and follow other instructions from your health care provider.  Eat and drink lightly if you think you are going into labor.  If Braxton Hicks contractions are making you uncomfortable: ? Change your position from lying  down or resting to walking, or change from walking to resting. ? Sit and rest in a tub of warm water. ? Drink enough fluid to keep your urine pale yellow. Dehydration may cause these contractions. ? Do slow and deep breathing several times an hour.  Keep all follow-up prenatal visits as told by your health care provider. This is important. Contact a health care provider if:  You have a fever.  You have continuous pain in  your abdomen. Get help right away if:  Your contractions become stronger, more regular, and closer together.  You have fluid leaking or gushing from your vagina.  You pass blood-tinged mucus (bloody show).  You have bleeding from your vagina.  You have low back pain that you never had before.  You feel your baby's head pushing down and causing pelvic pressure.  Your baby is not moving inside you as much as it used to. Summary  Contractions that occur before labor are called Braxton Hicks contractions, false labor, or practice contractions.  Braxton Hicks contractions are usually shorter, weaker, farther apart, and less regular than true labor contractions. True labor contractions usually become progressively stronger and regular and they become more frequent.  Manage discomfort from [facility] contractions by changing position, resting in a warm bath, drinking plenty of water, or practicing deep breathing. This information is not intended to replace advice given to you by your health care provider. Make sure you discuss any questions you have with your health care provider. Document Released: 01/23/2017 Document Revised: 01/23/2017 Document Reviewed: 01/23/2017 Elsevier Interactive Patient Education  2018 [employer].

## 2018-07-09 NOTE — Progress Notes (Signed)
   LOW-RISK PREGNANCY VISIT Patient name: Chelsea Strong MRN [id number]  Date of birth: 1990/12/24 Chief Complaint:   Routine Prenatal Visit  History of Present Illness:   Chelsea Strong is a 27 y.o. [id number] female at [redacted]w[redacted]d with an Estimated Date of Delivery: 07/21/18 being seen today for ongoing management of a low-risk pregnancy.  Today she reports no complaints. Contractions: Irregular.  .  Movement: Present. denies leaking of fluid. Review of Systems:   Pertinent items are noted in HPI Denies abnormal vaginal discharge w/ itching/odor/irritation, headaches, visual changes, shortness of breath, chest pain, abdominal pain, severe nausea/vomiting, or problems with urination or bowel movements unless otherwise stated above. Pertinent History Reviewed:  Reviewed past medical,surgical, social, obstetrical and family history.  Reviewed problem list, medications and allergies. Physical Assessment:   Vitals:   07/09/18 1446  BP: 110/68  Pulse: 61  Weight: 171 lb 9.6 oz (77.8 kg)  Body mass index is 32.42 kg/m.        Physical Examination:   General appearance: Well appearing, and in no distress  Mental status: Alert, oriented to person, place, and time  Skin: Warm & dry  Cardiovascular: Normal heart rate noted  Respiratory: Normal respiratory effort, no distress  Abdomen: Soft, gravid, nontender  Pelvic: Cervical exam performed  Dilation: 4 Effacement (%): 70 Station: -2  Extremities: Edema: None  Fetal Status: Fetal Heart Rate (bpm): 136 Fundal Height: 37 cm Movement: Present Presentation: Vertex  Results for orders placed or performed in visit on 07/09/18 (from the past 24 hour(s))  POC Urinalysis Dipstick OB   Collection Time: 07/09/18  2:50 PM  Result Value Ref Range   Color, UA     Clarity, UA     Glucose, UA Negative Negative   Bilirubin, UA     Ketones, UA neg    Spec Grav, UA     Blood, UA neg    pH, UA     POC Protein UA Negative Negative, Trace   Urobilinogen, UA     Nitrite, UA neg    Leukocytes, UA Negative Negative   Appearance     Odor      Assessment & Plan:  1) Low-risk pregnancy [id number] at [redacted]w[redacted]d with an Estimated Date of Delivery: 07/21/18    Meds: No orders of the defined types were placed in this encounter.  Labs/procedures today: sve  Plan:  Continue routine obstetrical care   Reviewed: Term labor symptoms and general obstetric precautions including but not limited to vaginal bleeding, contractions, leaking of fluid and fetal movement were reviewed in detail with the patient.  All questions were answered  Follow-up: Return in about 1 week (around 07/16/2018) for [place].  Orders Placed This Encounter  Procedures  . POC Urinalysis Dipstick OB   Roma Schanz CNM, [facility] 07/09/2018 3:08 PM

## 2018-07-16 ENCOUNTER — Ambulatory Visit (INDEPENDENT_AMBULATORY_CARE_PROVIDER_SITE_OTHER): Payer: BLUE CROSS/BLUE SHIELD | Admitting: Obstetrics and Gynecology

## 2018-07-16 ENCOUNTER — Encounter: Payer: Self-pay | Admitting: Obstetrics and Gynecology

## 2018-07-16 VITALS — BP 110/69 | HR 75 | Wt 170.2 lb

## 2018-07-16 DIAGNOSIS — Z3A39 39 weeks gestation of pregnancy: Secondary | ICD-10-CM

## 2018-07-16 DIAGNOSIS — Z1389 Encounter for screening for other disorder: Secondary | ICD-10-CM

## 2018-07-16 DIAGNOSIS — Z3483 Encounter for supervision of other normal pregnancy, third trimester: Secondary | ICD-10-CM

## 2018-07-16 DIAGNOSIS — Z331 Pregnant state, incidental: Secondary | ICD-10-CM

## 2018-07-16 LAB — POCT URINALYSIS DIPSTICK OB
Blood, UA: NEGATIVE
Glucose, UA: NEGATIVE
Ketones, UA: NEGATIVE
LEUKOCYTES UA: NEGATIVE
Nitrite, UA: NEGATIVE
PROTEIN: NEGATIVE

## 2018-07-16 NOTE — Progress Notes (Signed)
Patient ID: Chelsea Strong, female   DOB: 04-30-1991, 27 y.o.   MRN: [id number]   LOW-RISK PREGNANCY VISIT Patient name: Chelsea Strong MRN [id number]  Date of birth: 01-Apr-1991 Chief Complaint:   Routine Prenatal Visit  History of Present Illness:   Chelsea Strong is a 27 y.o. [id number] female at [redacted]w[redacted]d with an Estimated Date of Delivery: 07/21/18 being seen today for ongoing management of a low-risk pregnancy.  Today she reports no complaints. 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 She did not sign the papers to get her tubes tied but is still interested. She will use Depo until she can get a BTL. She is going to be breastfeeding this child and has used a breast pump.   Contractions: Not present. Vag. Bleeding: None.  Movement: Present. denies leaking of fluid. Review of Systems:   Pertinent items are noted in HPI Denies abnormal vaginal discharge w/ itching/odor/irritation, headaches, visual changes, shortness of breath, chest pain, abdominal pain, severe nausea/vomiting, or problems with urination or bowel movements unless otherwise stated above. Pertinent History Reviewed:  Reviewed past medical,surgical, social, obstetrical and family history.  Reviewed problem list, medications and allergies. Physical Assessment:   Vitals:   07/16/18 1033  BP: 110/69  Pulse: 75  Weight: 170 lb 3.2 oz (77.2 kg)  Body mass index is 32.16 kg/m.        Physical Examination:   General appearance: Well appearing, and in no distress  Mental status: Alert, oriented to person, place, and time  Skin: Warm & dry  Cardiovascular: Normal heart rate noted  Respiratory: Normal respiratory effort, no distress  Abdomen: Soft, gravid, nontender  Pelvic: Cervical exam performed 4 cm dilated        Extremities: Edema: None  Fetal Status:   Fundal Height: 36 cm Movement: Present Presentation: Vertex  Results for orders  placed or performed in visit on 07/16/18 (from the past 24 hour(s))  POC Urinalysis Dipstick OB   Collection Time: 07/16/18 10:34 AM  Result Value Ref Range   Color, UA     Clarity, UA     Glucose, UA Negative Negative   Bilirubin, UA     Ketones, UA neg    Spec Grav, UA     Blood, UA neg    pH, UA     POC Protein UA Negative Negative, Trace   Urobilinogen, UA     Nitrite, UA neg    Leukocytes, UA Negative Negative   Appearance     Odor      Assessment & Plan:  1) Low-risk pregnancy [id number] at [redacted]w[redacted]d with an Estimated Date of Delivery: 07/21/18    Plan:   1) Continue routine obstetrical care 2) NST next week 3) Sign BTL papers today            Meds: No orders of the defined types were placed in this encounter.  Labs/procedures today: Doppler  Reviewed: Term labor symptoms and general obstetric precautions including but not limited to vaginal bleeding, contractions, leaking of fluid and fetal movement were reviewed in detail with the patient.  All questions were answered  Follow-up: Return in about 1 week (around 07/23/2018) for NST.  Orders Placed This Encounter  Procedures  . POC Urinalysis Dipstick OB   By signing my name below, I, De Burrs, attest that this documentation has been prepared under the direction and in the presence  of Jonnie Kind, MD. Electronically Signed: De Burrs, Medical Scribe. 07/16/18. 10:48 AM.  I personally performed the services described in this documentation, which was SCRIBED in my presence. The recorded information has been reviewed and considered accurate. It has been edited as necessary during review. Jonnie Kind, MD

## 2018-07-19 ENCOUNTER — Inpatient Hospital Stay (HOSPITAL_COMMUNITY)
Admission: AD | Admit: 2018-07-19 | Discharge: 2018-07-21 | DRG: 807 | Disposition: A | Discharge status: Home / Self Care | Payer: BLUE CROSS/BLUE SHIELD | Attending: Obstetrics & Gynecology | Admitting: Obstetrics & Gynecology

## 2018-07-19 ENCOUNTER — Encounter (HOSPITAL_COMMUNITY): Payer: Self-pay

## 2018-07-19 ENCOUNTER — Other Ambulatory Visit: Payer: Self-pay

## 2018-07-19 DIAGNOSIS — Z3A39 39 weeks gestation of pregnancy: Secondary | ICD-10-CM

## 2018-07-19 DIAGNOSIS — Z3483 Encounter for supervision of other normal pregnancy, third trimester: Secondary | ICD-10-CM

## 2018-07-19 LAB — URINALYSIS, ROUTINE W REFLEX MICROSCOPIC
BILIRUBIN URINE: NEGATIVE
Glucose, UA: NEGATIVE mg/dL
HGB URINE DIPSTICK: NEGATIVE
KETONES UR: 80 mg/dL — AB
Nitrite: NEGATIVE
Protein, ur: NEGATIVE mg/dL
Specific Gravity, Urine: 1.016 (ref 1.005–1.030)
pH: 6 (ref 5.0–8.0)

## 2018-07-19 LAB — CBC
HCT: 34.1 % — ABNORMAL LOW (ref 36.0–46.0)
Hemoglobin: 11.4 g/dL — ABNORMAL LOW (ref 12.0–15.0)
MCH: 29.6 pg (ref 26.0–34.0)
MCHC: 33.4 g/dL (ref 30.0–36.0)
MCV: 88.6 fL (ref 80.0–100.0)
Platelets: 204 K/uL (ref 150–400)
RBC: 3.85 MIL/uL — ABNORMAL LOW (ref 3.87–5.11)
RDW: 12.9 % (ref 11.5–15.5)
WBC: 8.4 K/uL (ref 4.0–10.5)
nRBC: 0 % (ref 0.0–0.2)

## 2018-07-19 LAB — TYPE AND SCREEN
ABO/RH(D): A POS
Antibody Screen: NEGATIVE

## 2018-07-19 MED ORDER — LACTATED RINGERS IV SOLN
INTRAVENOUS | Status: DC
  Administered 2018-07-19 – 2018-07-20 (×3): via INTRAVENOUS

## 2018-07-19 MED ORDER — SOD CITRATE-CITRIC ACID 500-334 MG/5ML PO SOLN: 30.0000 mL | ORAL | Status: DC | PRN

## 2018-07-19 MED ORDER — FLEET ENEMA 7-19 GM/118ML RE ENEM: 1.0000 | ENEMA | RECTAL | Status: DC | PRN

## 2018-07-19 MED ORDER — ACETAMINOPHEN 325 MG PO TABS: 650.0000 mg | ORAL_TABLET | ORAL | Status: DC | PRN

## 2018-07-19 MED ORDER — OXYCODONE-ACETAMINOPHEN 5-325 MG PO TABS: 1.0000 | ORAL_TABLET | ORAL | Status: DC | PRN

## 2018-07-19 MED ORDER — OXYTOCIN BOLUS FROM INFUSION
500.0000 mL | Freq: Once | INTRAVENOUS | Status: AC
  Administered 2018-07-20: 500 mL via INTRAVENOUS

## 2018-07-19 MED ORDER — TERBUTALINE SULFATE 1 MG/ML IJ SOLN
0.2500 mg | Freq: Once | INTRAMUSCULAR | Status: DC | PRN
  Filled 2018-07-19: qty 1

## 2018-07-19 MED ORDER — ONDANSETRON HCL 4 MG/2ML IJ SOLN: 4.0000 mg | Freq: Four times a day (QID) | INTRAMUSCULAR | Status: DC | PRN

## 2018-07-19 MED ORDER — OXYTOCIN 40 UNITS IN LACTATED RINGERS INFUSION - SIMPLE MED
2.5000 [IU]/h | INTRAVENOUS | Status: DC
  Administered 2018-07-20: 2.5 [IU]/h via INTRAVENOUS

## 2018-07-19 MED ORDER — LIDOCAINE HCL (PF) 1 % IJ SOLN
30.0000 mL | INTRAMUSCULAR | Status: DC | PRN
  Filled 2018-07-19: qty 30

## 2018-07-19 MED ORDER — LACTATED RINGERS IV SOLN
500.0000 mL | INTRAVENOUS | Status: DC | PRN
  Administered 2018-07-20: 500 mL via INTRAVENOUS

## 2018-07-19 MED ORDER — OXYCODONE-ACETAMINOPHEN 5-325 MG PO TABS: 2.0000 | ORAL_TABLET | ORAL | Status: DC | PRN

## 2018-07-19 MED ORDER — OXYTOCIN 40 UNITS IN LACTATED RINGERS INFUSION - SIMPLE MED
1.0000 m[IU]/min | INTRAVENOUS | Status: DC
  Administered 2018-07-19: 1 m[IU]/min via INTRAVENOUS
  Filled 2018-07-19: qty 1000

## 2018-07-19 NOTE — H&P (Addendum)
LABOR AND DELIVERY ADMISSION HISTORY AND PHYSICAL NOTE  Chelsea Strong is a 27 y.o. female [phone] with IUP at [redacted]w[redacted]d by L/12 presenting for labor.  She reports positive fetal movement. She denies leakage of fluid or vaginal bleeding.  Prenatal History/Complications: PNC at [facility] Pregnancy complications:  - Abnormal Pap - History of trich and chlamydia, both treated - History of vacuum in first delivery  Past Medical History: Past Medical History:  Diagnosis Date  . History of chlamydia   . History of trichomoniasis   . Vaginal Pap smear, abnormal     Past Surgical History: Past Surgical History:  Procedure Laterality Date  . NO PAST SURGERIES    . THERAPEUTIC ABORTION  03/15/1015    Obstetrical History: OB History    Gravida  6   Para  3   Term  3   Preterm      AB  2   Living  3     SAB  1   TAB  1   Ectopic      Multiple      Live Births  3           Social History: Social History   Socioeconomic History  . Marital status: Single    Spouse name: Not on file  . Number of children: Not on file  . Years of education: Not on file  . Highest education level: Not on file  Occupational History  . Not on file  Social Needs  . Financial resource strain: Not on file  . Food insecurity:    Worry: Not on file    Inability: Not on file  . Transportation needs:    Medical: Not on file    Non-medical: Not on file  Tobacco Use  . Smoking status: Never Smoker  . Smokeless tobacco: Never Used  Substance and Sexual Activity  . Alcohol use: No  . Drug use: No  . Sexual activity: Yes    Birth control/protection: None  Lifestyle  . Physical activity:    Days per week: Not on file    Minutes per session: Not on file  . Stress: Not on file  Relationships  . Social connections:    Talks on phone: Not on file    Gets together: Not on file    Attends religious service: Not on file    Active member of club or organization: Not on file   Attends meetings of clubs or organizations: Not on file    Relationship status: Not on file  Other Topics Concern  . Not on file  Social History Narrative  . Not on file    Family History: Family History  Problem Relation Age of Onset  . Asthma Father   . Diabetes Father   . Cancer Paternal Aunt        breast    Allergies: No Known Allergies  Medications Prior to Admission  Medication Sig Dispense Refill Last Dose  . Prenat w/o A-[id number] (CITRANATAL RX) 27-1 MG TABS Take daily 30 each 12 07/19/2018 at Unknown time     Review of Systems  All systems reviewed and negative except as stated in HPI  Physical Exam Blood pressure 130/69, pulse 89, temperature 98 F (36.7 C), temperature source Oral, resp. rate 20, height 5\' 1"  (1.549 m), weight 76.7 kg, last menstrual period 10/14/2017. General appearance: alert, oriented, NAD Lungs: normal respiratory effort Heart: regular rate Abdomen: soft, non-tender; gravid, FH appropriate for GA Extremities: No  calf swelling or tenderness Presentation: cephalic Fetal monitoring: Cat 1 Uterine activity: CTX q51min Dilation: 5 Effacement (%): 60 Station: -2 Exam by:: Erasmo Score RNC   Prenatal labs: ABO, Rh: A/Positive/-- (04/18 1244) Antibody: Negative (07/25 1012) Rubella: 3.92 (04/18 1244) RPR: Non Reactive (07/25 1012)  HBsAg: Negative (04/18 1244)  HIV: Non Reactive (07/25 1012)  GC/Chlamydia: Negative GBS: Negative (10/10 1640)  2-hr GTT: Normal Genetic screening:  NT/IT neg Anatomy US: Normal female  Prenatal Transfer Tool  Maternal Diabetes: No Genetic Screening: Normal Maternal Ultrasounds/Referrals: Normal Fetal Ultrasounds or other Referrals:  None Maternal Substance Abuse:  No Significant Maternal Medications:  None Significant Maternal Lab Results: None  Results for orders placed or performed during the hospital encounter of 07/19/18 (from the past 24 hour(s))  Urinalysis, Routine w reflex  microscopic   Collection Time: 07/19/18  1:48 PM  Result Value Ref Range   Color, Urine YELLOW YELLOW   APPearance HAZY (A) CLEAR   Specific Gravity, Urine 1.016 1.005 - 1.030   pH 6.0 5.0 - 8.0   Glucose, UA NEGATIVE NEGATIVE mg/dL   Hgb urine dipstick NEGATIVE NEGATIVE   Bilirubin Urine NEGATIVE NEGATIVE   Ketones, ur 80 (A) NEGATIVE mg/dL   Protein, ur NEGATIVE NEGATIVE mg/dL   Nitrite NEGATIVE NEGATIVE   Leukocytes, UA TRACE (A) NEGATIVE   RBC / HPF 0-5 0 - 5 RBC/hpf   WBC, UA 0-5 0 - 5 WBC/hpf   Bacteria, UA RARE (A) NONE SEEN   Squamous Epithelial / LPF 11-20 0 - 5   Mucus PRESENT     Patient Active Problem List   Diagnosis Date Noted  . Abnormal Pap smear of cervix 02/02/2018  . Supervision of normal pregnancy 01/08/2018  . History of chlamydia 12/01/2012    Assessment: Chelsea Strong is a 27 y.o. [phone] at [redacted]w[redacted]d here for Labor  #Labor: Augment w/ Pitocin as needed #Pain: Natural birth, open to pain meds if needed #FWB: Cat I  #ID:  GBS(-) #MOF: [place] #MOC:Depo, interval BTL  #Circ:  Yes, outpatient  Mena Goes, MD 07/19/2018, 4:14 PM   OB FELLOW HISTORY AND PHYSICAL ATTESTATION  I have seen and examined this patient; I agree with above documentation in the resident's note.   Phill Myron, D.O. OB Fellow  07/19/2018, 5:58 PM

## 2018-07-19 NOTE — Progress Notes (Signed)
Chelsea Strong is a 27 y.o. [id number] at [redacted]w[redacted]d by L/12 admitted for active labor  Subjective: Coping well with contractions. Moving around room, reports contraction pain 7/10. Continues to desire unmedicated delivery.  Objective: BP 113/62   Pulse 85   Temp 98.1 F (36.7 C)   Resp 18   Ht 5\' 1"  (1.549 m)   Wt 76.7 kg   LMP 10/14/2017 (Exact Date)   BMI 31.95 kg/m  No intake/output data recorded. No intake/output data recorded.  FHT:  FHR: 125 bpm, variability: moderate,  accelerations:  Present,  decelerations:  Absent UC:   irregular, every 2-6 minutes SVE:   Dilation: 5.5 Effacement (%): 70 Station: -1 Exam by:: Mallie Snooks CNM  Labs: Lab Results  Component Value Date   WBC 8.4 07/19/2018   HGB 11.4 (L) 07/19/2018   HCT 34.1 (L) 07/19/2018   MCV 88.6 07/19/2018   PLT 204 07/19/2018    Assessment / Plan: Spontaneous labor, negligible change in cervical dilation since admission  Labor: Start Pitocin now, consider AROM when dilation and effacement have advanced Fetal Wellbeing:  Category I Pain Control:  Labor support without medications I/D:  n/a Anticipated MOD:  NSVD  Darlina Rumpf, CNM 07/19/2018, 8:44 PM

## 2018-07-19 NOTE — MAU Note (Signed)
Pt states ctx started around 1130 today, and have gradually gotten stronger.  Pt denies LOF or vag bleeding.  Reports good fetal movement.

## 2018-07-19 NOTE — Anesthesia Pain Management Evaluation Note (Signed)
  CRNA Pain Management Visit Note  Patient: Chelsea Strong, 27 y.o., female  "Hello I am a member of the anesthesia team at [facility]. We have an anesthesia team available at all times to provide care throughout the hospital, including epidural management and anesthesia for C-section. I don't know your plan for the delivery whether it a natural birth, water birth, IV sedation, nitrous supplementation, doula or epidural, but we want to meet your pain goals."   1.Was your pain managed to your expectations on prior hospitalizations?   Yes   2.What is your expectation for pain management during this hospitalization?     Labor support without medications  3.How can we help you reach that goal? Epidural if patient changes her mind - patient had previous epidurals that were satisfactory,  Record the patient's initial score and the patient's pain goal.   Pain: 7  Pain Goal: 10 [facility] wants you to be able to say your pain was always managed very well.  Clent Damore 07/19/2018

## 2018-07-20 ENCOUNTER — Telehealth: Payer: Self-pay | Admitting: *Deleted

## 2018-07-20 DIAGNOSIS — Z3A39 39 weeks gestation of pregnancy: Secondary | ICD-10-CM

## 2018-07-20 MED ORDER — OXYCODONE-ACETAMINOPHEN 5-325 MG PO TABS: 1.0000 | ORAL_TABLET | ORAL | Status: DC | PRN

## 2018-07-20 MED ORDER — COCONUT OIL OIL
1.0000 "application " | TOPICAL_OIL | Status: DC | PRN
  Administered 2018-07-20: 1 via TOPICAL
  Filled 2018-07-20: qty 120

## 2018-07-20 MED ORDER — PRENATAL MULTIVITAMIN CH
1.0000 | ORAL_TABLET | Freq: Every day | ORAL | Status: DC
  Administered 2018-07-20 – 2018-07-21 (×2): 1 via ORAL
  Filled 2018-07-20 (×2): qty 1

## 2018-07-20 MED ORDER — OXYCODONE-ACETAMINOPHEN 5-325 MG PO TABS: 2.0000 | ORAL_TABLET | ORAL | Status: DC | PRN

## 2018-07-20 MED ORDER — MAGNESIUM HYDROXIDE 400 MG/5ML PO SUSP: 30.0000 mL | ORAL | Status: DC | PRN

## 2018-07-20 MED ORDER — MEASLES, MUMPS & RUBELLA VAC ~~LOC~~ INJ
0.5000 mL | INJECTION | Freq: Once | SUBCUTANEOUS | Status: DC
  Filled 2018-07-20: qty 0.5

## 2018-07-20 MED ORDER — TETANUS-DIPHTH-ACELL PERTUSSIS 5-2.5-18.5 LF-MCG/0.5 IM SUSP: 0.5000 mL | Freq: Once | INTRAMUSCULAR | Status: DC

## 2018-07-20 MED ORDER — ACETAMINOPHEN 325 MG PO TABS: 650.0000 mg | ORAL_TABLET | ORAL | Status: DC | PRN

## 2018-07-20 MED ORDER — ONDANSETRON HCL 4 MG/2ML IJ SOLN: 4.0000 mg | INTRAMUSCULAR | Status: DC | PRN

## 2018-07-20 MED ORDER — ONDANSETRON HCL 4 MG PO TABS: 4.0000 mg | ORAL_TABLET | ORAL | Status: DC | PRN

## 2018-07-20 MED ORDER — BENZOCAINE-MENTHOL 20-0.5 % EX AERO: 1.0000 "application " | INHALATION_SPRAY | CUTANEOUS | Status: DC | PRN

## 2018-07-20 MED ORDER — DIBUCAINE 1 % RE OINT: 1.0000 "application " | TOPICAL_OINTMENT | RECTAL | Status: DC | PRN

## 2018-07-20 MED ORDER — DIPHENHYDRAMINE HCL 25 MG PO CAPS: 25.0000 mg | ORAL_CAPSULE | Freq: Four times a day (QID) | ORAL | Status: DC | PRN

## 2018-07-20 MED ORDER — IBUPROFEN 600 MG PO TABS
600.0000 mg | ORAL_TABLET | Freq: Four times a day (QID) | ORAL | Status: DC
  Administered 2018-07-20 – 2018-07-21 (×6): 600 mg via ORAL
  Filled 2018-07-20 (×6): qty 1

## 2018-07-20 MED ORDER — SIMETHICONE 80 MG PO CHEW: 80.0000 mg | CHEWABLE_TABLET | ORAL | Status: DC | PRN

## 2018-07-20 MED ORDER — WITCH HAZEL-GLYCERIN EX PADS: 1.0000 "application " | MEDICATED_PAD | CUTANEOUS | Status: DC | PRN

## 2018-07-20 MED ORDER — FERROUS SULFATE 325 (65 FE) MG PO TABS
325.0000 mg | ORAL_TABLET | Freq: Two times a day (BID) | ORAL | Status: DC
  Administered 2018-07-20 – 2018-07-21 (×3): 325 mg via ORAL
  Filled 2018-07-20 (×3): qty 1

## 2018-07-20 NOTE — Progress Notes (Signed)
Chelsea Strong is a 27 y.o. [phone] at [redacted]w[redacted]d admitted for active labor  Subjective: Continues to ambulate, tearful but coping well with contractions. Rates pain as 9/10. Continues to decline analgesia/anesthesia.  Objective: BP (!) 117/59   Pulse 68   Temp 97.9 F (36.6 C) (Oral)   Resp 18   Ht 5\' 1"  (1.549 m)   Wt 76.7 kg   LMP 10/14/2017 (Exact Date)   BMI 31.95 kg/m  No intake/output data recorded. No intake/output data recorded.  FHT:  FHR: 125 bpm, variability: moderate,  accelerations:  Present,  decelerations:  Absent UC:   irregular, every 1.5-6 minutes SVE:   Dilation: 7 Effacement (%): 80 Station: -1 Exam by:: S.Rheana Casebolt, CNM  Labs: Lab Results  Component Value Date   WBC 8.4 07/19/2018   HGB 11.4 (L) 07/19/2018   HCT 34.1 (L) 07/19/2018   MCV 88.6 07/19/2018   PLT 204 07/19/2018    Assessment / Plan: Labor: SOL augmentation with Pitocin, progressing well. Cervix slightly softer and more effaced. AROM now small amount, clear fluid Fetal Wellbeing:  Category I Pain Control:  Labor support without medications I/D:  n/a Anticipated MOD:  NSVD  Darlina Rumpf, CNM 07/20/2018, 3:07 AM

## 2018-07-20 NOTE — Progress Notes (Signed)
Chelsea Strong is a 27 y.o. [phone] at [redacted]w[redacted]d admitted for active labor  Subjective: Unmedicated labor, coping well and out of bed. Rates contractions as 7/10.   Objective: BP 124/78   Pulse 65   Temp 97.9 F (36.6 C) (Oral)   Resp 18   Ht 5\' 1"  (1.549 m)   Wt 76.7 kg   LMP 10/14/2017 (Exact Date)   BMI 31.95 kg/m  No intake/output data recorded. No intake/output data recorded.  FHT:  FHR: 125 bpm, variability: moderate,  accelerations:  Present,  decelerations:  Absent UC:   irregular, every 3-5 minutes SVE:   Dilation: 7 Effacement (%): 80 Station: -1 Exam by:: Rosine Abe CNM  Labs: Lab Results  Component Value Date   WBC 8.4 07/19/2018   HGB 11.4 (L) 07/19/2018   HCT 34.1 (L) 07/19/2018   MCV 88.6 07/19/2018   PLT 204 07/19/2018    Assessment / Plan: Spontaneous labor, progressing normally  Labor: Progressing on Pitocin, will continue to increase then AROM  Fetal Wellbeing:  Category I Pain Control:  Labor support without medications I/D:  n/a Anticipated MOD:  NSVD  Darlina Rumpf, CNM 07/20/2018, 12:25 AM

## 2018-07-20 NOTE — Lactation Note (Signed)
This note was copied from a baby's chart. Lactation Consultation Note  Patient Name: Chelsea Strong [id number] Date: 07/20/2018 Reason for consult: Initial assessment;Term  P4 mother whose infant is now 62 hours old.  Mother has limited breast feeding experience and stated she feels like she "did not know a lot about breast feeding" with her other children  Mother has breast fed once since delivery and felt like baby latched well.  Reviewed basic breast feeding issues including how to awaken a sleepy baby, feeding cues, latching effectively and hand expression.  Mother was shown hand expression and will do this before/after feedings.  Colostrum container provided for any EBM she obtains with hand expression.  Upon assessment, mother's breasts are soft and non tender and nipples are everted.  Discussed using EBM and coconut oil for comfort to nipples/areolas.    Encouraged to feed 8-12 times/24 hours or sooner if baby shows cues.  Mother will call RN/LC for latch assistance as needed.  Mom made aware of O/P services, breastfeeding support groups, community resources, and our phone # for post-discharge questions. Mother has DEBP for home use.   Maternal Data Formula Feeding for Exclusion: No Has patient been taught Hand Expression?: Yes Does the patient have breastfeeding experience prior to this delivery?: Yes  Feeding Feeding Type: Breast Fed  LATCH Score Latch: Grasps breast easily, tongue down, lips flanged, rhythmical sucking.  Audible Swallowing: None  Type of Nipple: Everted at rest and after stimulation  Comfort (Breast/Nipple): Soft / non-tender  Hold (Positioning): No assistance needed to correctly position infant at breast.  LATCH Score: 8  Interventions    Lactation Tools Discussed/Used WIC Program: No   Consult Status Consult Status: Follow-up Date: 07/21/18 Follow-up type: In-patient    Kaulder Zahner R Jamica Woodyard 07/20/2018, 9:44 AM

## 2018-07-20 NOTE — Plan of Care (Signed)
Pts. Condition will continue to improve 

## 2018-07-20 NOTE — Telephone Encounter (Signed)
Lmom for pt to call us back to schedule pp appointment.  07-20-18  AS

## 2018-07-21 ENCOUNTER — Encounter (HOSPITAL_COMMUNITY): Payer: Self-pay | Admitting: *Deleted

## 2018-07-21 LAB — RPR: RPR Ser Ql: NONREACTIVE

## 2018-07-21 MED ORDER — MEDROXYPROGESTERONE ACETATE 150 MG/ML IM SUSP
150.0000 mg | Freq: Once | INTRAMUSCULAR | Status: AC
  Administered 2018-07-21: 150 mg via INTRAMUSCULAR
  Filled 2018-07-21: qty 1

## 2018-07-21 MED ORDER — IBUPROFEN 600 MG PO TABS: 600.0000 mg | ORAL_TABLET | Freq: Four times a day (QID) | ORAL | 0 refills | Status: DC | PRN

## 2018-07-21 NOTE — Discharge Summary (Signed)
Postpartum Discharge Summary     Patient Name: Chelsea Strong DOB: December 15, 1990 MRN: [id number]  Date of admission: 07/19/2018 Delivering Provider: Darlina Rumpf   Date of discharge: 07/21/2018  Admitting diagnosis: 39 WKS, LABOR Intrauterine pregnancy: [redacted]w[redacted]d     Secondary diagnosis:  Active Problems:   Normal labor   Vaginal delivery  Additional problems: NOne     Discharge diagnosis: Term Pregnancy Delivered                                                                                                Post partum procedures:None  Augmentation: AROM and Pitocin  Complications: None  Hospital course:  Onset of Labor With Vaginal Delivery     27 y.o. yo [id number] at [redacted]w[redacted]d was admitted in Latent Labor on 07/19/2018. Patient had an uncomplicated labor course as follows:  Membrane Rupture Time/Date: 3:02 AM ,07/20/2018   Intrapartum Procedures: Episiotomy: None [1]                                         Lacerations:  None [1]  Patient had a delivery of a Viable infant. 07/20/2018  Information for the patient's newborn:  Tiffancy, Moger [[id number]]  Delivery Method: Vaginal, Spontaneous(Filed from Delivery Summary)    Pateint had an uncomplicated postpartum course.  She is ambulating, tolerating a regular diet, passing flatus, and urinating well. Patient is discharged home in stable condition on 07/21/18.   Magnesium Sulfate recieved: No BMZ received: No  Physical exam  Vitals:   07/20/18 1232 07/20/18 1633 07/20/18 2015 07/21/18 0530  BP: (!) 96/50 117/85 (!) 118/58 120/86  Pulse: (!) 57 (!) 56 62 68  Resp: 16  20 20   Temp:  98.4 F (36.9 C) 98.4 F (36.9 C) 98.2 F (36.8 C)  TempSrc:  Oral    SpO2: 99% 100% 99% 100%  Weight:      Height:       General: alert, cooperative and no distress Lochia: appropriate Uterine Fundus: firm Incision: N/A DVT Evaluation: No evidence of DVT seen on physical exam. Labs: Lab Results  Component Value Date   WBC 8.4 07/19/2018   HGB 11.4 (L) 07/19/2018   HCT 34.1 (L) 07/19/2018   MCV 88.6 07/19/2018   PLT 204 07/19/2018   CMP Latest Ref Rng & Units 11/14/2012  Glucose 70 - 99 mg/dL 82  BUN 6 - 23 mg/dL 10  Creatinine 0.50 - 1.10 mg/dL 0.76  Sodium 135 - 145 mEq/L 133(L)  Potassium 3.5 - 5.1 mEq/L 3.4(L)  Chloride 96 - 112 mEq/L 100  CO2 19 - 32 mEq/L 24  Calcium 8.4 - 10.5 mg/dL 8.6  Total Protein 6.0 - 8.3 g/dL 7.4  Total Bilirubin 0.3 - 1.2 mg/dL 0.4  Alkaline Phos 39 - 117 U/L 58  AST 0 - 37 U/L 20  ALT 0 - 35 U/L 16    Discharge instruction: per After Visit Summary and "Baby and Me Booklet".  After visit meds:  Allergies as  of 07/21/2018   No Known Allergies     Medication List    TAKE these medications   CITRANATAL RX 27-1 MG Tabs Take daily   ibuprofen 600 MG tablet Commonly known as:  ADVIL,MOTRIN Take 1 tablet (600 mg total) by mouth every 6 (six) hours as needed.       Diet: routine diet  Activity: Advance as tolerated. Pelvic rest for 6 weeks.   Outpatient follow up:4 weeks Follow up Appt:No future appointments. Follow up Visit: [place]. Schedule an appointment as soon as possible for a visit in 4 week(s).   Why:  postpartum visit Contact information: [place] [id number] [place] [facility] Follow up.   Why:  as needed in emergencies Contact information: [address] [id number] [place] [phone]           Please schedule this patient for Postpartum visit in: 4 weeks with the following provider: MD For C/S patients schedule nurse incision check in weeks 2 weeks: no Low risk pregnancy complicated by: Nothing Delivery mode:  SVD Anticipated Birth Control:  Plans Interval BTL PP Procedures needed: Pre-op visit  Schedule Integrated [place] visit: no      Newborn Data: Live born female  Birth  Weight: 7 lb 1.2 oz (3210 g) APGAR: 9, 9  Newborn Delivery   Birth date/time:  07/20/2018 05:30:00 Delivery type:  Vaginal, Spontaneous     Baby Feeding: Bottle and Breast Disposition:home with mother   07/21/2018 Manya Silvas, CNM

## 2018-07-21 NOTE — Discharge Instructions (Signed)
Vaginal Delivery, Care After °Refer to this sheet in the next few weeks. These instructions provide you with information about caring for yourself after vaginal delivery. Your health care provider may also give you more specific instructions. Your treatment has been planned according to current medical practices, but problems sometimes occur. Call your health care provider if you have any problems or questions. °What can I expect after the procedure? °After vaginal delivery, it is common to have: °· Some bleeding from your vagina. °· Soreness in your abdomen, your vagina, and the area of skin between your vaginal opening and your anus (perineum). °· Pelvic cramps. °· Fatigue. ° °Follow these instructions at home: °Medicines °· Take over-the-counter and prescription medicines only as told by your health care provider. °· If you were prescribed an antibiotic medicine, take it as told by your health care provider. Do not stop taking the antibiotic until it is finished. °Driving ° °· Do not drive or operate heavy machinery while taking prescription pain medicine. °· Do not drive for 24 hours if you received a sedative. °Lifestyle °· Do not drink alcohol. This is especially important if you are breastfeeding or taking medicine to relieve pain. °· Do not use tobacco products, including cigarettes, chewing tobacco, or e-cigarettes. If you need help quitting, ask your health care provider. °Eating and drinking °· Drink at least 8 eight-ounce glasses of water every day unless you are told not to by your health care provider. If you choose to breastfeed your baby, you may need to drink more water than this. °· Eat high-fiber foods every day. These foods may help prevent or relieve constipation. High-fiber foods include: °? Whole grain cereals and breads. °? Brown rice. °? Beans. °? Fresh fruits and vegetables. °Activity °· Return to your normal activities as told by your health care provider. Ask your health care provider  what activities are safe for you. °· Rest as much as possible. Try to rest or take a nap when your baby is sleeping. °· Do not lift anything that is heavier than your baby or 10 lb (4.5 kg) until your health care provider says that it is safe. °· Talk with your health care provider about when you can engage in sexual activity. This may depend on your: °? Risk of infection. °? Rate of healing. °? Comfort and desire to engage in sexual activity. °Vaginal Care °· If you have an episiotomy or a vaginal tear, check the area every day for signs of infection. Check for: °? More redness, swelling, or pain. °? More fluid or blood. °? Warmth. °? Pus or a bad smell. °· Do not use tampons or douches until your health care provider says this is safe. °· Watch for any blood clots that may pass from your vagina. These may look like clumps of dark red, brown, or black discharge. °General instructions °· Keep your perineum clean and dry as told by your health care provider. °· Wear loose, comfortable clothing. °· Wipe from front to back when you use the toilet. °· Ask your health care provider if you can shower or take a bath. If you had an episiotomy or a perineal tear during labor and delivery, your health care provider may tell you not to take baths for a certain length of time. °· Wear a bra that supports your breasts and fits you well. °· If possible, have someone help you with household activities and help care for your baby for at least a few days after   you leave the hospital.  Keep all follow-up visits for you and your baby as told by your health care provider. This is important. Contact a health care provider if:  You have: ? Vaginal discharge that has a bad smell. ? Difficulty urinating. ? Pain when urinating. ? A sudden increase or decrease in the frequency of your bowel movements. ? More redness, swelling, or pain around your episiotomy or vaginal tear. ? More fluid or blood coming from your episiotomy or  vaginal tear. ? Pus or a bad smell coming from your episiotomy or vaginal tear. ? A fever. ? A rash. ? Little or no interest in activities you used to enjoy. ? Questions about caring for yourself or your baby.  Your episiotomy or vaginal tear feels warm to the touch.  Your episiotomy or vaginal tear is separating or does not appear to be healing.  Your breasts are painful, hard, or turn red.  You feel unusually sad or worried.  You feel nauseous or you vomit.  You pass large blood clots from your vagina. If you pass a blood clot from your vagina, save it to show to your health care provider. Do not flush blood clots down the toilet without having your health care provider look at them.  You urinate more than usual.  You are dizzy or light-headed.  You have not breastfed at all and you have not had a menstrual period for 12 weeks after delivery.  You have stopped breastfeeding and you have not had a menstrual period for 12 weeks after you stopped breastfeeding. Get help right away if:  You have: ? Pain that does not go away or does not get better with medicine. ? Chest pain. ? Difficulty breathing. ? Blurred vision or spots in your vision. ? Thoughts about hurting yourself or your baby.  You develop pain in your abdomen or in one of your legs.  You develop a severe headache.  You faint.  You bleed from your vagina so much that you fill two sanitary pads in one hour. This information is not intended to replace advice given to you by your health care provider. Make sure you discuss any questions you have with your health care provider. Document Released: 09/06/2000 Document Revised: 02/21/2016 Document Reviewed: 09/24/2015 Elsevier Interactive Patient Education  2018 [employer].   Laparoscopic Tubal Ligation Laparoscopic tubal ligation is a procedure to close the fallopian tubes. This is done so that you cannot get pregnant. When the fallopian tubes are closed, the  eggs that your ovaries release cannot enter the uterus, and sperm cannot reach the released eggs. A laparoscopic tubal ligation is sometimes called "getting your tubes tied." You should not have this procedure if you want to get pregnant someday or if you are unsure about having more children. Tell a health care provider about:  Any allergies you have.  All medicines you are taking, including vitamins, herbs, eye drops, creams, and over-the-counter medicines.  Any problems you or family members have had with anesthetic medicines.  Any blood disorders you have.  Any surgeries you have had.  Any medical conditions you have.  Whether you are pregnant or may be pregnant.  Any past pregnancies. What are the risks? Generally, this is a safe procedure. However, problems may occur, including:  Infection.  Bleeding.  Injury to surrounding organs.  Side effects from anesthetics.  Failure of the procedure.  This procedure can increase your risk of a kind of pregnancy in which a fertilized  egg attaches to the outside of the uterus (ectopic pregnancy). What happens before the procedure?  Ask your health care provider about: ? Changing or stopping your regular medicines. This is especially important if you are taking diabetes medicines or blood thinners. ? Taking medicines such as aspirin and ibuprofen. These medicines can thin your blood. Do not take these medicines before your procedure if your health care provider instructs you not to.  Follow instructions from your health care provider about eating and drinking restrictions.  Plan to have someone take you home after the procedure.  If you go home right after the procedure, plan to have someone with you for 24 hours. What happens during the procedure?  You will be given one or more of the following: ? A medicine to help you relax (sedative). ? A medicine to numb the area (local anesthetic). ? A medicine to make you fall asleep  (general anesthetic). ? A medicine that is injected into an area of your body to numb everything below the injection site (regional anesthetic).  An IV tube will be inserted into one of your veins. It will be used to give you medicines and fluids during the procedure.  Your bladder may be emptied with a small tube (catheter).  If you have been given a general anesthetic, a tube will be put down your throat to help you breathe.  Two small cuts (incisions) will be made in your lower abdomen and near your belly button.  Your abdomen will be inflated with a gas. This will let the surgeon see better and will give the surgeon room to work.  A thin, lighted tube (laparoscope) with a camera attached will be inserted into your abdomen through one of the incisions. Small instruments will be inserted through the other incision.  The fallopian tubes will be tied off, burned (cauterized), or blocked with a clip, ring, or clamp. A small portion in the center of each fallopian tube may be removed.  The gas will be released from the abdomen.  The incisions will be closed with stitches (sutures).  A bandage (dressing) will be placed over the incisions. The procedure may vary among health care providers and hospitals. What happens after the procedure?  Your blood pressure, heart rate, breathing rate, and blood oxygen level will be monitored often until the medicines you were given have worn off.  You will be given medicine to help with pain, nausea, and vomiting as needed. This information is not intended to replace advice given to you by your health care provider. Make sure you discuss any questions you have with your health care provider. Document Released: 12/16/2000 Document Revised: 02/15/2016 Document Reviewed: 08/20/2015 Elsevier Interactive Patient Education  [employer].

## 2018-07-21 NOTE — Progress Notes (Signed)
POSTPARTUM PROGRESS NOTE  Post Partum Day 1  Subjective:  Chelsea Strong is a 27 y.o. [id number] s/p SVD at [redacted]w[redacted]d.  She reports she is doing well. No acute events overnight. She denies any problems with ambulating, voiding or po intake. Denies nausea or vomiting. She has had a BM. Pain is well controlled.  Lochia is slowing down.  Objective: Blood pressure 120/86, pulse 68, temperature 98.2 F (36.8 C), resp. rate 20, height 5\' 1"  (1.549 m), weight 76.7 kg, last menstrual period 10/14/2017, SpO2 100 %.  Physical Exam:  General: alert, cooperative and no distress Chest: no respiratory distress Heart:regular rate, distal pulses intact Abdomen: soft, nontender,  Uterine Fundus: firm, appropriately tender DVT Evaluation: No calf swelling or tenderness Extremities: no edema Skin: warm, dry  Recent Labs    07/19/18 1617  HGB 11.4*  HCT 34.1*    Assessment/Plan: Chelsea Strong is a 27 y.o. [id number] s/p SVD at [redacted]w[redacted]d   PPD#1 - Doing well  Routine postpartum care Contraception: BTL at 6 week checkup Feeding: both breast and bottle Dispo: Plan for discharge tomorrow.   LOS: 2 days   Ree Kida PA Student  07/21/2018, 7:45 AM

## 2018-07-23 ENCOUNTER — Other Ambulatory Visit: Payer: BLUE CROSS/BLUE SHIELD | Admitting: Obstetrics & Gynecology

## 2018-07-29 DIAGNOSIS — Z029 Encounter for administrative examinations, unspecified: Secondary | ICD-10-CM

## 2018-08-11 ENCOUNTER — Encounter: Payer: Self-pay | Admitting: *Deleted

## 2018-08-11 ENCOUNTER — Telehealth: Payer: Self-pay | Admitting: *Deleted

## 2018-08-25 ENCOUNTER — Encounter: Payer: Self-pay | Admitting: Advanced Practice Midwife

## 2018-08-25 ENCOUNTER — Other Ambulatory Visit: Payer: Self-pay

## 2018-08-25 ENCOUNTER — Ambulatory Visit (INDEPENDENT_AMBULATORY_CARE_PROVIDER_SITE_OTHER): Payer: BLUE CROSS/BLUE SHIELD | Admitting: Advanced Practice Midwife

## 2018-08-25 DIAGNOSIS — O99345 Other mental disorders complicating the puerperium: Secondary | ICD-10-CM | POA: Diagnosis not present

## 2018-08-25 DIAGNOSIS — F53 Postpartum depression: Secondary | ICD-10-CM | POA: Diagnosis not present

## 2018-08-25 MED ORDER — ESCITALOPRAM OXALATE 10 MG PO TABS: 10.0000 mg | ORAL_TABLET | Freq: Every day | ORAL | 6 refills | Status: DC

## 2018-08-25 NOTE — Patient Instructions (Signed)

## 2018-08-25 NOTE — Progress Notes (Signed)
Chelsea Strong is a 27 y.o. who presents for a postpartum visit. She is 5 weeks postpartum following a spontaneous vaginal delivery. I have fully reviewed the prenatal and intrapartum course. The delivery was at 39.6 gestational weeks.  Anesthesia: none. Postpartum course has been uneventful. Baby's course has been uneventful. Baby is feeding by bottle. Bleeding: no bleeding. Bowel function is normal. Bladder function is normal. Patient is not sexually active. Contraception method is Depo-Provera injections. Postpartum depression screening: positive. Feels sad a lot, doesn't want to get out of bed, not finding joy in things.  Never had PPD before.  Denies SI/HI.    Current Outpatient Medications:  .  Prenat w/o [id number] (CITRANATAL RX) 27-1 MG TABS, Take daily, Disp: 30 each, Rfl: 12 .  ibuprofen (ADVIL,MOTRIN) 600 MG tablet, Take 1 tablet (600 mg total) by mouth every 6 (six) hours as needed. (Patient not taking: Reported on 08/25/2018), Disp: 30 tablet, Rfl: 0  Review of Systems   Constitutional: Negative for fever and chills Eyes: Negative for visual disturbances Respiratory: Negative for shortness of breath, dyspnea Cardiovascular: Negative for chest pain or palpitations  Gastrointestinal: Negative for vomiting, diarrhea and constipation Genitourinary: Negative for dysuria and urgency Musculoskeletal: Negative for back pain, joint pain, myalgias  Neurological: Negative for dizziness and headaches    Objective:     Vitals:   08/25/18 1147  BP: 116/80  Pulse: (!) 56   General:  alert, cooperative and no distress   Breasts:  negative  Lungs: Normal respiratory effort  Heart:  regular rate and rhythm  Abdomen: Soft, nontender   Vulva:  normal  Vagina: normal vagina  Cervix:  closed  Corpus: Well involuted     Rectal Exam: no hemorrhoids        Assessment:    normal postpartum exam. PPD.   Plan:   1. Contraception: had depo on hospital but wants BTL (signed  papers right before delivering) 2 ASAP for BTL preop 3. PPD:  :  Offered therapy referral and accepted. Referral to [facility] faxed.  Start Lexapro 10mg  qd.

## 2018-08-28 ENCOUNTER — Ambulatory Visit (INDEPENDENT_AMBULATORY_CARE_PROVIDER_SITE_OTHER): Payer: BLUE CROSS/BLUE SHIELD | Admitting: Obstetrics & Gynecology

## 2018-08-28 ENCOUNTER — Encounter: Payer: Self-pay | Admitting: Obstetrics & Gynecology

## 2018-08-28 VITALS — BP 115/77 | HR 69 | Ht 61.0 in | Wt 147.0 lb

## 2018-08-28 DIAGNOSIS — Z3009 Encounter for other general counseling and advice on contraception: Secondary | ICD-10-CM

## 2018-08-28 NOTE — Progress Notes (Signed)
Preoperative History and Physical  Chelsea Strong is a 27 y.o. [phone] with No LMP recorded. admitted for a laparoscopic bilateral tubal ligation using electrocautry for the purpose of permanent sterilization.  Pt understands the permanent nature of the procedure and the failure rate of 1/300  PMH:    Past Medical History:  Diagnosis Date  . History of chlamydia   . History of trichomoniasis   . Vaginal Pap smear, abnormal     PSH:     Past Surgical History:  Procedure Laterality Date  . NO PAST SURGERIES    . THERAPEUTIC ABORTION  03/15/1015    POb/GynH:      OB History    Gravida  6   Para  4   Term  4   Preterm      AB  2   Living  4     SAB  1   TAB  1   Ectopic      Multiple      Live Births  4           SH:   Social History   Tobacco Use  . Smoking status: Never Smoker  . Smokeless tobacco: Never Used  Substance Use Topics  . Alcohol use: No  . Drug use: No    FH:    Family History  Problem Relation Age of Onset  . Asthma Father   . Diabetes Father   . Cancer Paternal Aunt        breast     Allergies: No Known Allergies  Medications:       Current Outpatient Medications:  .  escitalopram (LEXAPRO) 10 MG tablet, Take 1 tablet (10 mg total) by mouth daily., Disp: 30 tablet, Rfl: 6 .  Prenat w/o A-[id number] (CITRANATAL RX) 27-1 MG TABS, Take daily, Disp: 30 each, Rfl: 12 .  ibuprofen (ADVIL,MOTRIN) 600 MG tablet, Take 1 tablet (600 mg total) by mouth every 6 (six) hours as needed. (Patient not taking: Reported on 08/25/2018), Disp: 30 tablet, Rfl: 0  Review of Systems:   Review of Systems  Constitutional: Negative for fever, chills, weight loss, malaise/fatigue and diaphoresis.  HENT: Negative for hearing loss, ear pain, nosebleeds, congestion, sore throat, neck pain, tinnitus and ear discharge.   Eyes: Negative for blurred vision, double vision, photophobia, pain, discharge and redness.  Respiratory: Negative for cough,  hemoptysis, sputum production, shortness of breath, wheezing and stridor.   Cardiovascular: Negative for chest pain, palpitations, orthopnea, claudication, leg swelling and PND.  Gastrointestinal: Positive for abdominal pain. Negative for heartburn, nausea, vomiting, diarrhea, constipation, blood in stool and melena.  Genitourinary: Negative for dysuria, urgency, frequency, hematuria and flank pain.  Musculoskeletal: Negative for myalgias, back pain, joint pain and falls.  Skin: Negative for itching and rash.  Neurological: Negative for dizziness, tingling, tremors, sensory change, speech change, focal weakness, seizures, loss of consciousness, weakness and headaches.  Endo/Heme/Allergies: Negative for environmental allergies and polydipsia. Does not bruise/bleed easily.  Psychiatric/Behavioral: Negative for depression, suicidal ideas, hallucinations, memory loss and substance abuse. The patient is not nervous/anxious and does not have insomnia.      PHYSICAL EXAM:  Blood pressure 115/77, pulse 69, height 5\' 1"  (1.549 m), weight 147 lb (66.7 kg), not currently breastfeeding.    Vitals reviewed. Constitutional: She is oriented to person, place, and time. She appears well-developed and well-nourished.  HENT:  Head: Normocephalic and atraumatic.  Right Ear: External ear normal.  Left Ear: External ear normal.  Nose:  Nose normal.  Mouth/Throat: Oropharynx is clear and moist.  Eyes: Conjunctivae and EOM are normal. Pupils are equal, round, and reactive to light. Right eye exhibits no discharge. Left eye exhibits no discharge. No scleral icterus.  Neck: Normal range of motion. Neck supple. No tracheal deviation present. No thyromegaly present.  Cardiovascular: Normal rate, regular rhythm, normal heart sounds and intact distal pulses.  Exam reveals no gallop and no friction rub.   No murmur heard. Respiratory: Effort normal and breath sounds normal. No respiratory distress. She has no wheezes.  She has no rales. She exhibits no tenderness.  GI: Soft. Bowel sounds are normal. She exhibits no distension and no mass. There is tenderness. There is no rebound and no guarding.  Genitourinary:       Vulva is normal without lesions Vagina is pink moist without discharge Cervix normal in appearance and pap is normal Uterus is normal size, contour, position, consistency, mobility, non-tender Adnexa is negative with normal sized ovaries by sonogram  Musculoskeletal: Normal range of motion. She exhibits no edema and no tenderness.  Neurological: She is alert and oriented to person, place, and time. She has normal reflexes. She displays normal reflexes. No cranial nerve deficit. She exhibits normal muscle tone. Coordination normal.  Skin: Skin is warm and dry. No rash noted. No erythema. No pallor.  Psychiatric: She has a normal mood and affect. Her behavior is normal. Judgment and thought content normal.    Labs: No results found for this or any previous visit (from the past 336 hour(s)).  EKG: No orders found for this or any previous visit.  Imaging Studies: No results found.    Assessment: Multiparous female desires permanent sterilization  Patient Active Problem List   Diagnosis Date Noted  . 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 Vaginal delivery 07/21/2018  . Normal labor 07/19/2018  . Abnormal Pap smear of cervix 02/02/2018  . Supervision of normal pregnancy 01/08/2018  . History of chlamydia 12/01/2012    Plan: Laparoscopic bilateral tubal ligation using electrocautery  Mertie Clause Xayvier Vallez 08/28/2018 2:12 PM      Face to face time:  15 minutes  Greater than 50% of the visit time was spent in counseling and coordination of care with the patient.  The summary and outline of the counseling and care coordination is summarized in the note above.   All questions were answered.

## 2018-09-11 ENCOUNTER — Encounter: Payer: Self-pay | Admitting: *Deleted

## 2018-09-18 NOTE — Patient Instructions (Signed)
Chelsea Strong  [birth year]     @PREFPERIOPPHARMACY @   Your procedure is scheduled on  09/30/2018.  Report to [facility] at  615   A.M.  Call this number if you have problems the morning of surgery:  [phone]   Remember:  Do not eat or drink after midnight.                         Take these medicines the morning of surgery with A SIP OF WATER lexapro.    Do not wear jewelry, make-up or nail polish.  Do not wear lotions, powders, or perfumes, or deodorant.  Do not shave 48 hours prior to surgery.  Men may shave face and neck.  Do not bring valuables to the hospital.  [facility] is not responsible for any belongings or valuables.  Contacts, dentures or bridgework may not be worn into surgery.  Leave your suitcase in the car.  After surgery it may be brought to your room.  For patients admitted to the hospital, discharge time will be determined by your treatment team.  Patients discharged the day of surgery will not be allowed to drive home.   Name and phone number of your driver:   family Special instructions:  None  Please read over the following fact sheets that you were given. Anesthesia Post-op Instructions and Care and Recovery After Surgery       Laparoscopic Tubal Ligation Laparoscopic tubal ligation is a procedure to close the fallopian tubes. This is done so that you cannot get pregnant. When the fallopian tubes are closed, the eggs that your ovaries release cannot enter the uterus, and sperm cannot reach the released eggs. A laparoscopic tubal ligation is sometimes called "getting your tubes tied." You should not have this procedure if you want to get pregnant someday or if you are unsure about having more children. Tell a health care provider about:  Any allergies you have.  All medicines you are taking, including vitamins, herbs, eye drops, creams, and over-the-counter medicines.  Any problems you or family members have had with  anesthetic medicines.  Any blood disorders you have.  Any surgeries you have had.  Any medical conditions you have.  Whether you are pregnant or may be pregnant.  Any past pregnancies. What are the risks? Generally, this is a safe procedure. However, problems may occur, including:  Infection.  Bleeding.  Injury to surrounding organs.  Side effects from anesthetics.  Failure of the procedure. This procedure can increase your risk of a kind of pregnancy in which a fertilized egg attaches to the outside of the uterus (ectopic pregnancy). What happens before the procedure?  Ask your health care provider about: ? Changing or stopping your regular medicines. This is especially important if you are taking diabetes medicines or blood thinners. ? Taking medicines such as aspirin and ibuprofen. These medicines can thin your blood. Do not take these medicines before your procedure if your health care provider instructs you not to.  Follow instructions from your health care provider about eating and drinking restrictions.  Plan to have someone take you home after the procedure.  If you go home right after the procedure, plan to have someone with you for 24 hours. What happens during the procedure?      You will be given one or more of the following: ? A medicine to help you relax (sedative). ?  A medicine to numb the area (local anesthetic). ? A medicine to make you fall asleep (general anesthetic). ? A medicine that is injected into an area of your body to numb everything below the injection site (regional anesthetic).  An IV tube will be inserted into one of your veins. It will be used to give you medicines and fluids during the procedure.  Your bladder may be emptied with a small tube (catheter).  If you have been given a general anesthetic, a tube will be put down your throat to help you breathe.  Two small cuts (incisions) will be made in your lower abdomen and near your  belly button.  Your abdomen will be inflated with a gas. This will let the surgeon see better and will give the surgeon room to work.  A thin, lighted tube (laparoscope) with a camera attached will be inserted into your abdomen through one of the incisions. Small instruments will be inserted through the other incision.  The fallopian tubes will be tied off, burned (cauterized), or blocked with a clip, ring, or clamp. A small portion in the center of each fallopian tube may be removed.  The gas will be released from the abdomen.  The incisions will be closed with stitches (sutures).  A bandage (dressing) will be placed over the incisions. The procedure may vary among health care providers and hospitals. What happens after the procedure?  Your blood pressure, heart rate, breathing rate, and blood oxygen level will be monitored often until the medicines you were given have worn off.  You will be given medicine to help with pain, nausea, and vomiting as needed. This information is not intended to replace advice given to you by your health care provider. Make sure you discuss any questions you have with your health care provider. Document Released: 12/16/2000 Document Revised: 05/06/2017 Document Reviewed: 08/20/2015 Elsevier Interactive Patient Education  2019 [employer]  Laparoscopic Tubal Ligation, Care After Refer to this sheet in the next few weeks. These instructions provide you with information about caring for yourself after your procedure. Your health care provider may also give you more specific instructions. Your treatment has been planned according to current medical practices, but problems sometimes occur. Call your health care provider if you have any problems or questions after your procedure. What can I expect after the procedure? After the procedure, it is common to have:  A sore throat.  Discomfort in your shoulder.  Mild discomfort or cramping in your abdomen.  Gas  pains.  Pain or soreness in the area where the surgical cut (incision) was made.  A bloated feeling.  Tiredness.  Nausea.  Vomiting. Follow these instructions at home: Medicines  Take over-the-counter and prescription medicines only as told by your health care provider.  Do not take aspirin because it can cause bleeding.  Do not drive or operate heavy machinery while taking prescription pain medicine. Activity  Rest for the rest of the day.  Return to your normal activities as told by your health care provider. Ask your health care provider what activities are safe for you. Incision care      Follow instructions from your health care provider about how to take care of your incision. Make sure you: ? Wash your hands with soap and water before you change your bandage (dressing). If soap and water are not available, use hand sanitizer. ? Change your dressing as told by your health care provider. ? Leave stitches (sutures) in place. They may  need to stay in place for 2 weeks or longer.  Check your incision area every day for signs of infection. Check for: ? More redness, swelling, or pain. ? More fluid or blood. ? Warmth. ? Pus or a bad smell. Other Instructions  Do not take baths, swim, or use a hot tub until your health care provider approves. You may take showers.  Keep all follow-up visits as told by your health care provider. This is important.  Have someone help you with your daily household tasks for the first few days. Contact a health care provider if:  You have more redness, swelling, or pain around your incision.  Your incision feels warm to the touch.  You have pus or a bad smell coming from your incision.  The edges of your incision break open after the sutures have been removed.  Your pain does not improve after 2-3 days.  You have a rash.  You repeatedly become dizzy or light-headed.  Your pain medicine is not helping.  You are  constipated. Get help right away if:  You have a fever.  You faint.  You have increasing pain in your abdomen.  You have severe pain in one or both of your shoulders.  You have fluid or blood coming from your sutures or from your vagina.  You have shortness of breath or difficulty breathing.  You have chest pain or leg pain.  You have ongoing nausea, vomiting, or diarrhea. This information is not intended to replace advice given to you by your health care provider. Make sure you discuss any questions you have with your health care provider. Document Released: 03/29/2005 Document Revised: 05/06/2017 Document Reviewed: 08/20/2015 Elsevier Interactive Patient Education  2019 [place] Anesthesia, Adult General anesthesia is the use of medicines to make a person "go to sleep" (unconscious) for a medical procedure. General anesthesia must be used for certain procedures, and is often recommended for procedures that:  Last a long time.  Require you to be still or in an unusual position.  Are major and can cause blood loss. The medicines used for general anesthesia are called general anesthetics. As well as making you unconscious for a certain amount of time, these medicines:  Prevent pain.  Control your blood pressure.  Relax your muscles. Tell a health care provider about:  Any allergies you have.  All medicines you are taking, including vitamins, herbs, eye drops, creams, and over-the-counter medicines.  Any problems you or family members have had with anesthetic medicines.  Types of anesthetics you have had in the past.  Any blood disorders you have.  Any surgeries you have had.  Any medical conditions you have.  Any recent upper respiratory, chest, or ear infections.  Any history of: ? Heart or lung conditions, such as heart failure, sleep apnea, asthma, or chronic obstructive pulmonary disease (COPD). ? Armed forces logistics/support/administrative officer. ? Depression or  anxiety.  Any tobacco or drug use, including marijuana or alcohol use.  Whether you are pregnant or may be pregnant. What are the risks? Generally, this is a safe procedure. However, problems may occur, including:  Allergic reaction.  Lung and heart problems.  Inhaling food or liquid from the stomach into the lungs (aspiration).  Nerve injury.  Dental injury.  Air in the bloodstream, which can lead to stroke.  Extreme agitation or confusion (delirium) when you wake up from the anesthetic.  Waking up during your procedure and being unable to move. This is rare. These problems  are more likely to develop if you are having a major surgery or if you have an advanced or serious medical condition. You can prevent some of these complications by answering all of your health care provider's questions thoroughly and by following all instructions before your procedure. General anesthesia can cause side effects, including:  Nausea or vomiting.  A sore throat from the breathing tube.  Hoarseness.  Wheezing or coughing.  Shaking chills.  Tiredness.  Body aches.  Anxiety.  Sleepiness or drowsiness.  Confusion or agitation. What happens before the procedure? Staying hydrated Follow instructions from your health care provider about hydration, which may include:  Up to 2 hours before the procedure - you may continue to drink clear liquids, such as water, clear fruit juice, black coffee, and plain tea.  Eating and drinking restrictions Follow instructions from your health care provider about eating and drinking, which may include:  8 hours before the procedure - stop eating heavy meals or foods such as meat, fried foods, or fatty foods.  6 hours before the procedure - stop eating light meals or foods, such as toast or cereal.  6 hours before the procedure - stop drinking milk or drinks that contain milk.  2 hours before the procedure - stop drinking clear  liquids. Medicines Ask your health care provider about:  Changing or stopping your regular medicines. This is especially important if you are taking diabetes medicines or blood thinners.  Taking medicines such as aspirin and ibuprofen. These medicines can thin your blood. Do not take these medicines unless your health care provider tells you to take them.  Taking over-the-counter medicines, vitamins, herbs, and supplements. Do not take these during the week before your procedure unless your health care provider approves them. General instructions  Starting 3-6 weeks before the procedure, do not use any products that contain nicotine or tobacco, such as cigarettes and e-cigarettes. If you need help quitting, ask your health care provider.  If you brush your teeth on the morning of the procedure, make sure to spit out all of the toothpaste.  Tell your health care provider if you become ill or develop a cold, cough, or fever.  If instructed by your health care provider, bring your sleep apnea device with you on the day of your surgery (if applicable).  Ask your health care provider if you will be going home the same day, the following day, or after a longer hospital stay. ? Plan to have someone take you home from the hospital or clinic. ? Plan to have a responsible adult care for you for at least 24 hours after you leave the hospital or clinic. This is important. What happens during the procedure?   You will be given anesthetics through both of the following: ? A mask placed over your nose and mouth. ? An IV in one of your veins.  You may receive a medicine to help you relax (sedative).  After you are unconscious, a breathing tube may be inserted down your throat to help you breathe. This will be removed before you wake up.  An anesthesia specialist will stay with you throughout your procedure. He or she will: ? Keep you comfortable and safe by continuing to give you medicines and  adjusting the amount of medicine that you get. ? Monitor your blood pressure, pulse, and oxygen levels to make sure that the anesthetics do not cause any problems. The procedure may vary among health care providers and hospitals. What happens after  the procedure?  Your blood pressure, temperature, heart rate, breathing rate, and blood oxygen level will be monitored until the medicines you were given have worn off.  You will wake up in a recovery area. You may wake up slowly.  If you feel anxious or agitated, you may be given medicine to help you calm down.  If you will be going home the same day, your health care provider may check to make sure you can walk, drink, and urinate.  Your health care provider will treat any pain or side effects you have before you go home.  Do not drive for 24 hours if you were given a sedative. Summary  General anesthesia is used to keep you still and prevent pain during a procedure.  It is important to tell your health care provider about your medical history and any surgeries you have had, and previous experience with anesthesia.  Follow your health care provider's instructions about when to stop eating, drinking, or taking certain medicines before your procedure.  Plan to have someone take you home from the hospital or clinic. This information is not intended to replace advice given to you by your health care provider. Make sure you discuss any questions you have with your health care provider. Document Released: 12/17/2007 Document Revised: 01/27/2018 Document Reviewed: 04/25/2017 Elsevier Interactive Patient Education  2019 [place] Anesthesia, Adult, Care After This sheet gives you information about how to care for yourself after your procedure. Your health care provider may also give you more specific instructions. If you have problems or questions, contact your health care provider. What can I expect after the procedure? After the  procedure, the following side effects are common:  Pain or discomfort at the IV site.  Nausea.  Vomiting.  Sore throat.  Trouble concentrating.  Feeling cold or chills.  Weak or tired.  Sleepiness and fatigue.  Soreness and body aches. These side effects can affect parts of the body that were not involved in surgery. Follow these instructions at home:  For at least 24 hours after the procedure:  Have a responsible adult stay with you. It is important to have someone help care for you until you are awake and alert.  Rest as needed.  Do not: ? Participate in activities in which you could fall or become injured. ? Drive. ? Use heavy machinery. ? Drink alcohol. ? Take sleeping pills or medicines that cause drowsiness. ? Make important decisions or sign legal documents. ? Take care of children on your own. Eating and drinking  Follow any instructions from your health care provider about eating or drinking restrictions.  When you feel hungry, start by eating small amounts of foods that are soft and easy to digest (bland), such as toast. Gradually return to your regular diet.  Drink enough fluid to keep your urine pale yellow.  If you vomit, rehydrate by drinking water, juice, or clear broth. General instructions  If you have sleep apnea, surgery and certain medicines can increase your risk for breathing problems. Follow instructions from your health care provider about wearing your sleep device: ? Anytime you are sleeping, including during daytime naps. ? While taking prescription pain medicines, sleeping medicines, or medicines that make you drowsy.  Return to your normal activities as told by your health care provider. Ask your health care provider what activities are safe for you.  Take over-the-counter and prescription medicines only as told by your health care provider.  If you smoke, do not  smoke without supervision.  Keep all follow-up visits as told by your  health care provider. This is important. Contact a health care provider if:  You have nausea or vomiting that does not get better with medicine.  You cannot eat or drink without vomiting.  You have pain that does not get better with medicine.  You are unable to pass urine.  You develop a skin rash.  You have a fever.  You have redness around your IV site that gets worse. Get help right away if:  You have difficulty breathing.  You have chest pain.  You have blood in your urine or stool, or you vomit blood. Summary  After the procedure, it is common to have a sore throat or nausea. It is also common to feel tired.  Have a responsible adult stay with you for the first 24 hours after general anesthesia. It is important to have someone help care for you until you are awake and alert.  When you feel hungry, start by eating small amounts of foods that are soft and easy to digest (bland), such as toast. Gradually return to your regular diet.  Drink enough fluid to keep your urine pale yellow.  Return to your normal activities as told by your health care provider. Ask your health care provider what activities are safe for you. This information is not intended to replace advice given to you by your health care provider. Make sure you discuss any questions you have with your health care provider. Document Released: 12/16/2000 Document Revised: 04/25/2017 Document Reviewed: 04/25/2017 Elsevier Interactive Patient Education  2019 [employer].

## 2018-09-28 ENCOUNTER — Encounter (HOSPITAL_COMMUNITY): Payer: Self-pay

## 2018-09-28 ENCOUNTER — Encounter (HOSPITAL_COMMUNITY)
Admission: RE | Admit: 2018-09-28 | Discharge: 2018-09-28 | Disposition: A | Discharge status: Home / Self Care | Payer: BLUE CROSS/BLUE SHIELD | Source: Ambulatory Visit | Attending: Obstetrics & Gynecology | Admitting: Obstetrics & Gynecology

## 2018-09-28 ENCOUNTER — Other Ambulatory Visit: Payer: Self-pay | Admitting: Obstetrics & Gynecology

## 2018-09-28 DIAGNOSIS — Z01812 Encounter for preprocedural laboratory examination: Secondary | ICD-10-CM | POA: Insufficient documentation

## 2018-09-28 LAB — CBC
HCT: 37.2 % (ref 36.0–46.0)
Hemoglobin: 11.7 g/dL — ABNORMAL LOW (ref 12.0–15.0)
MCH: 28.2 pg (ref 26.0–34.0)
MCHC: 31.5 g/dL (ref 30.0–36.0)
MCV: 89.6 fL (ref 80.0–100.0)
Platelets: 389 10*3/uL (ref 150–400)
RBC: 4.15 MIL/uL (ref 3.87–5.11)
RDW: 11.7 % (ref 11.5–15.5)
WBC: 6.1 10*3/uL (ref 4.0–10.5)
nRBC: 0 % (ref 0.0–0.2)

## 2018-09-28 LAB — URINALYSIS, ROUTINE W REFLEX MICROSCOPIC
Bacteria, UA: NONE SEEN
Bilirubin Urine: NEGATIVE
GLUCOSE, UA: NEGATIVE mg/dL
Ketones, ur: NEGATIVE mg/dL
Leukocytes, UA: NEGATIVE
NITRITE: NEGATIVE
Protein, ur: NEGATIVE mg/dL
Specific Gravity, Urine: 1.023 (ref 1.005–1.030)
pH: 5 (ref 5.0–8.0)

## 2018-09-28 LAB — RAPID HIV SCREEN (HIV 1/2 AB+AG)
HIV 1/2 Antibodies: NONREACTIVE
HIV-1 P24 Antigen - HIV24: NONREACTIVE

## 2018-09-28 LAB — COMPREHENSIVE METABOLIC PANEL
ALT: 14 U/L (ref 0–44)
AST: 16 U/L (ref 15–41)
Albumin: 3.9 g/dL (ref 3.5–5.0)
Alkaline Phosphatase: 86 U/L (ref 38–126)
Anion gap: 5 (ref 5–15)
BUN: 14 mg/dL (ref 6–20)
CALCIUM: 8.7 mg/dL — AB (ref 8.9–10.3)
CO2: 22 mmol/L (ref 22–32)
Chloride: 110 mmol/L (ref 98–111)
Creatinine, Ser: 0.8 mg/dL (ref 0.44–1.00)
GFR calc Af Amer: >60 mL/min (ref >60)
GFR calc non Af Amer: >60 mL/min (ref >60)
GLUCOSE: 86 mg/dL (ref 70–99)
Potassium: 3.6 mmol/L (ref 3.5–5.1)
SODIUM: 137 mmol/L (ref 135–145)
TOTAL PROTEIN: 7.8 g/dL (ref 6.5–8.1)
Total Bilirubin: 0.5 mg/dL (ref 0.3–1.2)

## 2018-09-28 LAB — HCG, QUANTITATIVE, PREGNANCY: hCG, Beta Chain, Quant, S: <1 m[IU]/mL (ref <5)

## 2018-09-30 ENCOUNTER — Ambulatory Visit (HOSPITAL_COMMUNITY): Payer: BLUE CROSS/BLUE SHIELD | Admitting: Anesthesiology

## 2018-09-30 ENCOUNTER — Encounter (HOSPITAL_COMMUNITY): Payer: Self-pay | Admitting: *Deleted

## 2018-09-30 ENCOUNTER — Encounter (HOSPITAL_COMMUNITY)
Admission: RE | Disposition: A | Discharge status: Home / Self Care | Payer: Self-pay | Source: Home / Self Care | Attending: Obstetrics & Gynecology

## 2018-09-30 ENCOUNTER — Encounter: Payer: Self-pay | Admitting: *Deleted

## 2018-09-30 ENCOUNTER — Ambulatory Visit (HOSPITAL_COMMUNITY)
Admission: RE | Admit: 2018-09-30 | Discharge: 2018-09-30 | Disposition: A | Discharge status: Home / Self Care | Payer: BLUE CROSS/BLUE SHIELD | Attending: Obstetrics & Gynecology | Admitting: Obstetrics & Gynecology

## 2018-09-30 DIAGNOSIS — Z79899 Other long term (current) drug therapy: Secondary | ICD-10-CM | POA: Insufficient documentation

## 2018-09-30 DIAGNOSIS — Z302 Encounter for sterilization: Secondary | ICD-10-CM | POA: Diagnosis not present

## 2018-09-30 HISTORY — PX: LAPAROSCOPIC TUBAL LIGATION: SHX1937

## 2018-09-30 SURGERY — LIGATION, FALLOPIAN TUBE, LAPAROSCOPIC
Anesthesia: General | Laterality: Bilateral

## 2018-09-30 MED ORDER — KETOROLAC TROMETHAMINE 10 MG PO TABS: 10.0000 mg | ORAL_TABLET | Freq: Three times a day (TID) | ORAL | 0 refills | Status: DC | PRN

## 2018-09-30 MED ORDER — MIDAZOLAM HCL 2 MG/2ML IJ SOLN
INTRAMUSCULAR | Status: AC
  Filled 2018-09-30: qty 2

## 2018-09-30 MED ORDER — ONDANSETRON 8 MG PO TBDP: 8.0000 mg | ORAL_TABLET | Freq: Three times a day (TID) | ORAL | 0 refills | Status: DC | PRN

## 2018-09-30 MED ORDER — PROMETHAZINE HCL 25 MG/ML IJ SOLN: 6.2500 mg | INTRAMUSCULAR | Status: DC | PRN

## 2018-09-30 MED ORDER — MEPERIDINE HCL 50 MG/ML IJ SOLN: 6.2500 mg | INTRAMUSCULAR | Status: DC | PRN

## 2018-09-30 MED ORDER — FENTANYL CITRATE (PF) 250 MCG/5ML IJ SOLN
INTRAMUSCULAR | Status: AC
  Filled 2018-09-30: qty 5

## 2018-09-30 MED ORDER — HYDROMORPHONE HCL 1 MG/ML IJ SOLN: 0.2500 mg | INTRAMUSCULAR | Status: DC | PRN

## 2018-09-30 MED ORDER — FENTANYL CITRATE (PF) 100 MCG/2ML IJ SOLN
INTRAMUSCULAR | Status: DC | PRN
  Administered 2018-09-30: 100 ug via INTRAVENOUS

## 2018-09-30 MED ORDER — ONDANSETRON HCL 4 MG/2ML IJ SOLN
INTRAMUSCULAR | Status: DC | PRN
  Administered 2018-09-30: 4 mg via INTRAVENOUS

## 2018-09-30 MED ORDER — KETOROLAC TROMETHAMINE 30 MG/ML IJ SOLN
INTRAMUSCULAR | Status: AC
  Filled 2018-09-30: qty 1

## 2018-09-30 MED ORDER — HYDROCODONE-ACETAMINOPHEN 5-325 MG PO TABS: 1.0000 | ORAL_TABLET | Freq: Four times a day (QID) | ORAL | 0 refills | Status: DC | PRN

## 2018-09-30 MED ORDER — ROCURONIUM BROMIDE 10 MG/ML (PF) SYRINGE
PREFILLED_SYRINGE | INTRAVENOUS | Status: AC
  Filled 2018-09-30: qty 10

## 2018-09-30 MED ORDER — CEFAZOLIN SODIUM-DEXTROSE 2-4 GM/100ML-% IV SOLN
2.0000 g | INTRAVENOUS | Status: AC
  Administered 2018-09-30: 2 g via INTRAVENOUS

## 2018-09-30 MED ORDER — SUGAMMADEX SODIUM 500 MG/5ML IV SOLN
INTRAVENOUS | Status: DC | PRN
  Administered 2018-09-30: 150 mg via INTRAVENOUS

## 2018-09-30 MED ORDER — PROPOFOL 10 MG/ML IV BOLUS
INTRAVENOUS | Status: DC | PRN
  Administered 2018-09-30: 140 mg via INTRAVENOUS

## 2018-09-30 MED ORDER — KETOROLAC TROMETHAMINE 30 MG/ML IJ SOLN
30.0000 mg | Freq: Once | INTRAMUSCULAR | Status: AC
  Administered 2018-09-30: 30 mg via INTRAVENOUS

## 2018-09-30 MED ORDER — PROPOFOL 10 MG/ML IV BOLUS
INTRAVENOUS | Status: AC
  Filled 2018-09-30: qty 20

## 2018-09-30 MED ORDER — CEFAZOLIN SODIUM-DEXTROSE 2-4 GM/100ML-% IV SOLN
INTRAVENOUS | Status: AC
  Filled 2018-09-30: qty 100

## 2018-09-30 MED ORDER — LACTATED RINGERS IV SOLN: INTRAVENOUS | Status: DC

## 2018-09-30 MED ORDER — DEXAMETHASONE SODIUM PHOSPHATE 10 MG/ML IJ SOLN
INTRAMUSCULAR | Status: DC | PRN
  Administered 2018-09-30: 10 mg via INTRAVENOUS

## 2018-09-30 MED ORDER — ONDANSETRON HCL 4 MG/2ML IJ SOLN
INTRAMUSCULAR | Status: AC
  Filled 2018-09-30: qty 2

## 2018-09-30 MED ORDER — LACTATED RINGERS IV SOLN
INTRAVENOUS | Status: DC
  Administered 2018-09-30: 1000 mL via INTRAVENOUS

## 2018-09-30 MED ORDER — SODIUM CHLORIDE 0.9 % IR SOLN
Status: DC | PRN
  Administered 2018-09-30: 1000 mL

## 2018-09-30 MED ORDER — HYDROCODONE-ACETAMINOPHEN 7.5-325 MG PO TABS: 1.0000 | ORAL_TABLET | Freq: Once | ORAL | Status: DC | PRN

## 2018-09-30 MED ORDER — DEXAMETHASONE SODIUM PHOSPHATE 10 MG/ML IJ SOLN
INTRAMUSCULAR | Status: AC
  Filled 2018-09-30: qty 1

## 2018-09-30 MED ORDER — MIDAZOLAM HCL 2 MG/2ML IJ SOLN
INTRAMUSCULAR | Status: DC | PRN
  Administered 2018-09-30: 2 mg via INTRAVENOUS

## 2018-09-30 SURGICAL SUPPLY — 31 items
BLADE SURG SZ11 CARB STEEL (BLADE) ×2 IMPLANT
CLOTH BEACON ORANGE TIMEOUT ST (SAFETY) ×2 IMPLANT
COVER LIGHT HANDLE STERIS (MISCELLANEOUS) ×4 IMPLANT
COVER WAND RF STERILE (DRAPES) ×1 IMPLANT
ELECT REM PT RETURN 9FT ADLT (ELECTROSURGICAL) ×2
ELECTRODE REM PT RTRN 9FT ADLT (ELECTROSURGICAL) ×1 IMPLANT
GAUZE 4X4 16PLY RFD (DISPOSABLE) ×2 IMPLANT
GLOVE BIOGEL PI IND STRL 7.0 (GLOVE) ×3 IMPLANT
GLOVE BIOGEL PI IND STRL 8 (GLOVE) ×1 IMPLANT
GLOVE BIOGEL PI INDICATOR 7.0 (GLOVE) ×3
GLOVE BIOGEL PI INDICATOR 8 (GLOVE) ×1
GLOVE ECLIPSE 8.0 STRL XLNG CF (GLOVE) ×2 IMPLANT
GOWN STRL REUS W/TWL LRG LVL3 (GOWN DISPOSABLE) ×2 IMPLANT
GOWN STRL REUS W/TWL XL LVL3 (GOWN DISPOSABLE) ×2 IMPLANT
INST SET LAPROSCOPIC GYN AP (KITS) ×2 IMPLANT
KIT TURNOVER CYSTO (KITS) ×2 IMPLANT
MANIFOLD NEPTUNE II (INSTRUMENTS) ×2 IMPLANT
NDL INSUFFLATION 14GA 120MM (NEEDLE) ×1 IMPLANT
NEEDLE INSUFFLATION 14GA 120MM (NEEDLE) ×2 IMPLANT
PACK PERI GYN (CUSTOM PROCEDURE TRAY) ×2 IMPLANT
PAD ARMBOARD 7.5X6 YLW CONV (MISCELLANEOUS) ×2 IMPLANT
SET BASIN LINEN APH (SET/KITS/TRAYS/PACK) ×2 IMPLANT
SOLUTION ANTI FOG 6CC (MISCELLANEOUS) ×2 IMPLANT
SPONGE GAUZE 2X2 8PLY STRL LF (GAUZE/BANDAGES/DRESSINGS) ×3 IMPLANT
STAPLER VISISTAT 35W (STAPLE) ×2 IMPLANT
SUT VICRYL 0 UR6 27IN ABS (SUTURE) ×2 IMPLANT
SYR 10ML LL (SYRINGE) ×2 IMPLANT
TROCAR ENDO BLADELESS 11MM (ENDOMECHANICALS) ×2 IMPLANT
TROCAR XCEL NON-BLD 5MMX100MML (ENDOMECHANICALS) ×2 IMPLANT
TUBING INSUF HEATED (TUBING) ×2 IMPLANT
WARMER LAPAROSCOPE (MISCELLANEOUS) ×2 IMPLANT

## 2018-09-30 NOTE — Progress Notes (Signed)
Please excuse Chelsea Strong from work on 09/30/2018. She cannot drive, operate heavy machinery or sign legal documents for 24 hours. She has a follow up appointment with her Doctor.

## 2018-09-30 NOTE — H&P (Signed)
Preoperative History and Physical  Chelsea Strong is a 28 y.o. [phone] with No LMP recorded. admitted for a laparoscopic bilateral tubal ligation using electrocautry for the purpose of permanent sterilization.  Pt understands the permanent nature of the procedure and the failure rate of 1/300  PMH:        Past Medical History:  Diagnosis Date  . History of chlamydia   . History of trichomoniasis   . Vaginal Pap smear, abnormal     PSH:          Past Surgical History:  Procedure Laterality Date  . NO PAST SURGERIES    . THERAPEUTIC ABORTION  03/15/1015    POb/GynH:              OB History    Gravida  6   Para  4   Term  4   Preterm      AB  2   Living  4     SAB  1   TAB  1   Ectopic      Multiple      Live Births  4           SH:   Social History       Tobacco Use  . Smoking status: Never Smoker  . Smokeless tobacco: Never Used  Substance Use Topics  . Alcohol use: No  . Drug use: No    FH:         Family History  Problem Relation Age of Onset  . Asthma Father   . Diabetes Father   . Cancer Paternal Aunt        breast     Allergies: No Known Allergies  Medications:       Current Outpatient Medications:  .  escitalopram (LEXAPRO) 10 MG tablet, Take 1 tablet (10 mg total) by mouth daily., Disp: 30 tablet, Rfl: 6 .  Prenat w/o A-FeCb-FeGl-DSS-FA (CITRANATAL RX) 27-1 MG TABS, Take daily, Disp: 30 each, Rfl: 12 .  ibuprofen (ADVIL,MOTRIN) 600 MG tablet, Take 1 tablet (600 mg total) by mouth every 6 (six) hours as needed. (Patient not taking: Reported on 08/25/2018), Disp: 30 tablet, Rfl: 0  Review of Systems:   Review of Systems  Constitutional: Negative for fever, chills, weight loss, malaise/fatigue and diaphoresis.  HENT: Negative for hearing loss, ear pain, nosebleeds, congestion, sore throat, neck pain, tinnitus and ear discharge.   Eyes: Negative for blurred vision, double vision, photophobia,  pain, discharge and redness.  Respiratory: Negative for cough, hemoptysis, sputum production, shortness of breath, wheezing and stridor.   Cardiovascular: Negative for chest pain, palpitations, orthopnea, claudication, leg swelling and PND.  Gastrointestinal: Positive for abdominal pain. Negative for heartburn, nausea, vomiting, diarrhea, constipation, blood in stool and melena.  Genitourinary: Negative for dysuria, urgency, frequency, hematuria and flank pain.  Musculoskeletal: Negative for myalgias, back pain, joint pain and falls.  Skin: Negative for itching and rash.  Neurological: Negative for dizziness, tingling, tremors, sensory change, speech change, focal weakness, seizures, loss of consciousness, weakness and headaches.  Endo/Heme/Allergies: Negative for environmental allergies and polydipsia. Does not bruise/bleed easily.  Psychiatric/Behavioral: Negative for depression, suicidal ideas, hallucinations, memory loss and substance abuse. The patient is not nervous/anxious and does not have insomnia.      PHYSICAL EXAM:  Blood pressure 115/77, pulse 69, height 5\' 1"  (1.549 m), weight 147 lb (66.7 kg), not currently breastfeeding.    Vitals reviewed. Constitutional: She is oriented to person, place, and time.  She appears well-developed and well-nourished.  HENT:  Head: Normocephalic and atraumatic.  Right Ear: External ear normal.  Left Ear: External ear normal.  Nose: Nose normal.  Mouth/Throat: Oropharynx is clear and moist.  Eyes: Conjunctivae and EOM are normal. Pupils are equal, round, and reactive to light. Right eye exhibits no discharge. Left eye exhibits no discharge. No scleral icterus.  Neck: Normal range of motion. Neck supple. No tracheal deviation present. No thyromegaly present.  Cardiovascular: Normal rate, regular rhythm, normal heart sounds and intact distal pulses.  Exam reveals no gallop and no friction rub.   No murmur heard. Respiratory: Effort normal  and breath sounds normal. No respiratory distress. She has no wheezes. She has no rales. She exhibits no tenderness.  GI: Soft. Bowel sounds are normal. She exhibits no distension and no mass. There is tenderness. There is no rebound and no guarding.  Genitourinary:       Vulva is normal without lesions Vagina is pink moist without discharge Cervix normal in appearance and pap is normal Uterus is normal size, contour, position, consistency, mobility, non-tender Adnexa is negative with normal sized ovaries by sonogram  Musculoskeletal: Normal range of motion. She exhibits no edema and no tenderness.  Neurological: She is alert and oriented to person, place, and time. She has normal reflexes. She displays normal reflexes. No cranial nerve deficit. She exhibits normal muscle tone. Coordination normal.  Skin: Skin is warm and dry. No rash noted. No erythema. No pallor.  Psychiatric: She has a normal mood and affect. Her behavior is normal. Judgment and thought content normal.    Labs: No results found for this or any previous visit (from the past 336 hour(s)).  EKG: No orders found for this or any previous visit.  Imaging Studies: Imaging Results  No results found.      Assessment: Multiparous female desires permanent sterilization      Patient Active Problem List   Diagnosis Date Noted  . 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 Vaginal delivery 07/21/2018  . Normal labor 07/19/2018  . Abnormal Pap smear of cervix 02/02/2018  . Supervision of normal pregnancy 01/08/2018  . History of chlamydia 12/01/2012    Plan: Laparoscopic bilateral tubal ligation using electrocautery  Mertie Clause Eure 09/30/2018 7:07 AM

## 2018-09-30 NOTE — Anesthesia Preprocedure Evaluation (Signed)
Anesthesia Evaluation    Airway Mallampati: II       Dental  (+) Teeth Intact, Dental Advidsory Given   Pulmonary    breath sounds clear to auscultation       Cardiovascular  Rhythm:regular     Neuro/Psych    GI/Hepatic   Endo/Other    Renal/GU      Musculoskeletal   Abdominal   Peds  Hematology   Anesthesia Other Findings No sig PMH States npo No PSHx, no family anesthesia issues occ MJ use  Reproductive/Obstetrics                             Anesthesia Physical Anesthesia Plan  ASA: II  Anesthesia Plan: General   Post-op Pain Management:    Induction:   PONV Risk Score and Plan:   Airway Management Planned:   Additional Equipment:   Intra-op Plan:   Post-operative Plan:   Informed Consent: I have reviewed the patients History and Physical, chart, labs and discussed the procedure including the risks, benefits and alternatives for the proposed anesthesia with the patient or authorized representative who has indicated his/her understanding and acceptance.   Dental Advisory Given  Plan Discussed with: Anesthesiologist  Anesthesia Plan Comments:         Anesthesia Quick Evaluation

## 2018-09-30 NOTE — Discharge Instructions (Signed)
Laparoscopic Tubal Ligation, Care After Refer to this sheet in the next few weeks. These instructions provide you with information about caring for yourself after your procedure. Your health care provider may also give you more specific instructions. Your treatment has been planned according to current medical practices, but problems sometimes occur. Call your health care provider if you have any problems or questions after your procedure. What can I expect after the procedure? After the procedure, it is common to have:  A sore throat.  Discomfort in your shoulder.  Mild discomfort or cramping in your abdomen.  Gas pains.  Pain or soreness in the area where the surgical cut (incision) was made.  A bloated feeling.  Tiredness.  Nausea.  Vomiting. Follow these instructions at home: Medicines  Take over-the-counter and prescription medicines only as told by your health care provider.  Do not take aspirin because it can cause bleeding.  Do not drive or operate heavy machinery while taking prescription pain medicine. Activity  Rest for the rest of the day.  Return to your normal activities as told by your health care provider. Ask your health care provider what activities are safe for you. Incision care      Follow instructions from your health care provider about how to take care of your incision. Make sure you: ? Wash your hands with soap and water before you change your bandage (dressing). If soap and water are not available, use hand sanitizer. ? Change your dressing as told by your health care provider. ? Leave stitches (sutures) in place. They may need to stay in place for 2 weeks or longer.  Check your incision area every day for signs of infection. Check for: ? More redness, swelling, or pain. ? More fluid or blood. ? Warmth. ? Pus or a bad smell. Other Instructions  Do not take baths, swim, or use a hot tub until your health care provider approves. You may take  showers.  Keep all follow-up visits as told by your health care provider. This is important.  Have someone help you with your daily household tasks for the first few days. Contact a health care provider if:  You have more redness, swelling, or pain around your incision.  Your incision feels warm to the touch.  You have pus or a bad smell coming from your incision.  The edges of your incision break open after the sutures have been removed.  Your pain does not improve after 2-3 days.  You have a rash.  You repeatedly become dizzy or light-headed.  Your pain medicine is not helping.  You are constipated. Get help right away if:  You have a fever.  You faint.  You have increasing pain in your abdomen.  You have severe pain in one or both of your shoulders.  You have fluid or blood coming from your sutures or from your vagina.  You have shortness of breath or difficulty breathing.  You have chest pain or leg pain.  You have ongoing nausea, vomiting, or diarrhea. This information is not intended to replace advice given to you by your health care provider. Make sure you discuss any questions you have with your health care provider. Document Released: 03/29/2005 Document Revised: 05/06/2017 Document Reviewed: 08/20/2015 Elsevier Interactive Patient Education  2019 [place] Anesthesia, Adult, Care After This sheet gives you information about how to care for yourself after your procedure. Your health care provider may also give you more specific instructions. If  you have problems or questions, contact your health care provider. What can I expect after the procedure? After the procedure, the following side effects are common:  Pain or discomfort at the IV site.  Nausea.  Vomiting.  Sore throat.  Trouble concentrating.  Feeling cold or chills.  Weak or tired.  Sleepiness and fatigue.  Soreness and body aches. These side effects can affect parts  of the body that were not involved in surgery. Follow these instructions at home:  For at least 24 hours after the procedure:  Have a responsible adult stay with you. It is important to have someone help care for you until you are awake and alert.  Rest as needed.  Do not: ? Participate in activities in which you could fall or become injured. ? Drive. ? Use heavy machinery. ? Drink alcohol. ? Take sleeping pills or medicines that cause drowsiness. ? Make important decisions or sign legal documents. ? Take care of children on your own. Eating and drinking  Follow any instructions from your health care provider about eating or drinking restrictions.  When you feel hungry, start by eating small amounts of foods that are soft and easy to digest (bland), such as toast. Gradually return to your regular diet.  Drink enough fluid to keep your urine pale yellow.  If you vomit, rehydrate by drinking water, juice, or clear broth. General instructions  If you have sleep apnea, surgery and certain medicines can increase your risk for breathing problems. Follow instructions from your health care provider about wearing your sleep device: ? Anytime you are sleeping, including during daytime naps. ? While taking prescription pain medicines, sleeping medicines, or medicines that make you drowsy.  Return to your normal activities as told by your health care provider. Ask your health care provider what activities are safe for you.  Take over-the-counter and prescription medicines only as told by your health care provider.  If you smoke, do not smoke without supervision.  Keep all follow-up visits as told by your health care provider. This is important. Contact a health care provider if:  You have nausea or vomiting that does not get better with medicine.  You cannot eat or drink without vomiting.  You have pain that does not get better with medicine.  You are unable to pass urine.  You  develop a skin rash.  You have a fever.  You have redness around your IV site that gets worse. Get help right away if:  You have difficulty breathing.  You have chest pain.  You have blood in your urine or stool, or you vomit blood. Summary  After the procedure, it is common to have a sore throat or nausea. It is also common to feel tired.  Have a responsible adult stay with you for the first 24 hours after general anesthesia. It is important to have someone help care for you until you are awake and alert.  When you feel hungry, start by eating small amounts of foods that are soft and easy to digest (bland), such as toast. Gradually return to your regular diet.  Drink enough fluid to keep your urine pale yellow.  Return to your normal activities as told by your health care provider. Ask your health care provider what activities are safe for you. This information is not intended to replace advice given to you by your health care provider. Make sure you discuss any questions you have with your health care provider. Document Released: 12/16/2000 Document Revised:  04/25/2017 Document Reviewed: 04/25/2017 Elsevier Interactive Patient Education  [employer].

## 2018-09-30 NOTE — Transfer of Care (Signed)
Immediate Anesthesia Transfer of Care Note  Patient: Chelsea Strong  Procedure(s) Performed: LAPAROSCOPIC BILATERAL TUBAL LIGATION USING ELECTROCAUTERY (Bilateral )  Patient Location: PACU  Anesthesia Type:General  Level of Consciousness: awake and patient cooperative  Airway & Oxygen Therapy: Patient Spontanous Breathing and Patient connected to nasal cannula oxygen  Post-op Assessment: Report given to RN and Post -op Vital signs reviewed and stable  Post vital signs: Reviewed and stable  Last Vitals:  Vitals Value Taken Time  BP    Temp    Pulse    Resp    SpO2      Last Pain:  Vitals:   09/30/18 0657  TempSrc: Oral  PainSc: 0-No pain      Patients Stated Pain Goal: 8 (38/18/40 3754)  Complications: No apparent anesthesia complications

## 2018-09-30 NOTE — Anesthesia Postprocedure Evaluation (Signed)
Anesthesia Post Note  Patient: Chelsea Strong  Procedure(s) Performed: LAPAROSCOPIC BILATERAL TUBAL LIGATION USING ELECTROCAUTERY (Bilateral )  Patient location during evaluation: Short Stay Anesthesia Type: General Level of consciousness: awake and alert and patient cooperative Pain management: satisfactory to patient Vital Signs Assessment: post-procedure vital signs reviewed and stable Respiratory status: spontaneous breathing Cardiovascular status: stable Postop Assessment: no apparent nausea or vomiting Anesthetic complications: no Comments: Patient states her uluva feels less swollen. Denies difficulty breathing or sore throat.     Last Vitals:  Vitals:   09/30/18 0900 09/30/18 0913  BP: 129/77 122/78  Pulse: (!) 50 60  Resp: 14 15  Temp:  36.7 C  SpO2: 100% 100%    Last Pain:  Vitals:   09/30/18 0913  TempSrc: Oral  PainSc: 7                  Deangela Randleman

## 2018-09-30 NOTE — Anesthesia Postprocedure Evaluation (Signed)
Anesthesia Post Note  Patient: Chelsea Strong  Procedure(s) Performed: LAPAROSCOPIC BILATERAL TUBAL LIGATION USING ELECTROCAUTERY (Bilateral )  Patient location during evaluation: PACU Anesthesia Type: General Level of consciousness: awake and alert and patient cooperative Pain management: satisfactory to patient Vital Signs Assessment: post-procedure vital signs reviewed and stable Respiratory status: spontaneous breathing Cardiovascular status: stable Postop Assessment: no apparent nausea or vomiting Anesthetic complications: yes Anesthetic complication details: anesthesia complicationsComments: Patient states her vulva feels swollen. Denies pain.  Dr. Currie Paris made aware See [facility] for drug administration.     Last Vitals:  Vitals:   09/30/18 0657 09/30/18 0817  BP: 112/68 128/78  Pulse: (!) 56 60  Resp: 18 20  Temp: 36.8 C 36.7 C  SpO2: 100% 100%    Last Pain:  Vitals:   09/30/18 0830  TempSrc:   PainSc: 0-No pain                 Briannon Boggio

## 2018-09-30 NOTE — Op Note (Addendum)
Preoperative Diagnosis:  Multiparous female desires permanent sterilization  Postoperative Diagnosis:  Same as above  Procedure:  Laparoscopic Bilateral Tubal Ligation, using electrocautery  Surgeon:  Jacelyn Grip MD  Anaesthesia:  LMA  Findings:  Patient had normal pelvic anatomy and no intraperitoneal abnormalities.  Description of Operation:  Patient was taken to the OR and placed into supine position where she underwent LMA anaesthesia.  She was placed in the dorsal lithotomy position and prepped and draped in the usual sterile fashion.  An incision was made in the umbilicus and dissection taken down to the rectus fascia which was incised and opened.  The non bladed trocar was then placed and the peritoneal cavity was insufflated.  The above noted findings were observed. An incision was made in the LLQ and a 5 mm trocar was placed under direct visualization into the peritoneal cavity without difficulty.  The Kleppinger electrocautery unit was employed and both tubes were burned to no resistance and beyond in the distal ishtmic and ampullary regions, approximately a 3.5 cm segment bilaterally.  There was good hemostasis.  The fascia, peritoneum and subcutaneous tissue were closed using 0 vicryl.  The skin was closed using staples.  The patient was awakened from anaesthesia and taken to the PACU with all counts being correct x 3.  The patient received Ancef 2 gram and Toradol 30 mg IV preoperatively.  Blood loss was minimal  Photo documented     Florian Buff, MD 09/30/2018 8:12 AM

## 2018-09-30 NOTE — Anesthesia Procedure Notes (Signed)
Procedure Name: Intubation Date/Time: 09/30/2018 7:32 AM Performed by: Vista Deck, CRNA Pre-anesthesia Checklist: Patient identified, Patient being monitored, Timeout performed, Emergency Drugs available and Suction available Patient Re-evaluated:Patient Re-evaluated prior to induction Oxygen Delivery Method: Circle System Utilized Preoxygenation: Pre-oxygenation with 100% oxygen Induction Type: IV induction Ventilation: Mask ventilation without difficulty Laryngoscope Size: Mac and 3 Grade View: Grade II Tube type: Oral Tube size: 7.0 mm Number of attempts: 1 Airway Equipment and Method: stylet Placement Confirmation: ETT inserted through vocal cords under direct vision,  positive ETCO2 and breath sounds checked- equal and bilateral Secured at: 22 cm Tube secured with: Tape Dental Injury: Teeth and Oropharynx as per pre-operative assessment

## 2018-10-01 ENCOUNTER — Encounter (HOSPITAL_COMMUNITY): Payer: Self-pay | Admitting: Obstetrics & Gynecology

## 2018-10-08 ENCOUNTER — Encounter: Payer: Self-pay | Admitting: Obstetrics & Gynecology

## 2018-10-08 ENCOUNTER — Other Ambulatory Visit: Payer: Self-pay

## 2018-10-08 ENCOUNTER — Ambulatory Visit (INDEPENDENT_AMBULATORY_CARE_PROVIDER_SITE_OTHER): Payer: BLUE CROSS/BLUE SHIELD | Admitting: Obstetrics & Gynecology

## 2018-10-08 ENCOUNTER — Encounter: Payer: Self-pay | Admitting: *Deleted

## 2018-10-08 VITALS — BP 105/71 | HR 65 | Ht 61.0 in | Wt 152.0 lb

## 2018-10-08 DIAGNOSIS — Z9889 Other specified postprocedural states: Secondary | ICD-10-CM

## 2018-10-08 NOTE — Progress Notes (Signed)
  HPI: Patient returns for routine postoperative follow-up having undergone lap BTL bipolar on 09/30/2018.  The patient's immediate postoperative recovery has been unremarkable. Since hospital discharge the patient reports no problems.   Current Outpatient Medications: escitalopram (LEXAPRO) 10 MG tablet, Take 1 tablet (10 mg total) by mouth daily., Disp: 30 tablet, Rfl: 6 HYDROcodone-acetaminophen (NORCO/VICODIN) 5-325 MG tablet, Take 1 tablet by mouth every 6 (six) hours as needed., Disp: 10 tablet, Rfl: 0 ketorolac (TORADOL) 10 MG tablet, Take 1 tablet (10 mg total) by mouth every 8 (eight) hours as needed., Disp: 15 tablet, Rfl: 0 ondansetron (ZOFRAN ODT) 8 MG disintegrating tablet, Take 1 tablet (8 mg total) by mouth every 8 (eight) hours as needed for nausea or vomiting., Disp: 20 tablet, Rfl: 0 Prenat w/o A-FeCb-FeGl-DSS-FA (CITRANATAL RX) 27-1 MG TABS, Take daily (Patient taking differently: Take 1 tablet by mouth daily. ), Disp: 30 each, Rfl: 12 ibuprofen (ADVIL,MOTRIN) 600 MG tablet, Take 1 tablet (600 mg total) by mouth every 6 (six) hours as needed. (Patient not taking: Reported on 10/08/2018), Disp: 30 tablet, Rfl: 0  No current facility-administered medications for this visit.     Blood pressure 105/71, pulse 65, height 5\' 1"  (1.549 m), weight 152 lb (68.9 kg), last menstrual period 09/30/2018, not currently breastfeeding.  Physical Exam: Incisions x2 are healing well  Diagnostic Tests:   Pathology:   Impression: S/p lap BTL bipolar cautery  Plan:   Follow up:   Return in about 1 year (around 10/09/2019).   Florian Buff, MD

## 2018-11-23 NOTE — Telephone Encounter (Signed)
Note sent to nurse. 

## 2019-06-24 ENCOUNTER — Encounter: Payer: Self-pay | Admitting: Obstetrics & Gynecology

## 2019-06-24 ENCOUNTER — Ambulatory Visit: Payer: BC Managed Care – PPO

## 2019-06-24 ENCOUNTER — Other Ambulatory Visit: Payer: Self-pay

## 2019-06-24 ENCOUNTER — Ambulatory Visit (INDEPENDENT_AMBULATORY_CARE_PROVIDER_SITE_OTHER): Payer: BC Managed Care – PPO | Admitting: Obstetrics & Gynecology

## 2019-06-24 VITALS — BP 123/75 | HR 69 | Ht 61.0 in | Wt 146.0 lb

## 2019-06-24 DIAGNOSIS — N946 Dysmenorrhea, unspecified: Secondary | ICD-10-CM

## 2019-06-24 DIAGNOSIS — D649 Anemia, unspecified: Secondary | ICD-10-CM

## 2019-06-24 DIAGNOSIS — N920 Excessive and frequent menstruation with regular cycle: Secondary | ICD-10-CM

## 2019-06-24 LAB — POCT HEMOGLOBIN: Hemoglobin: 11.7 g/dL (ref 11–14.6)

## 2019-06-24 MED ORDER — MEDROXYPROGESTERONE ACETATE 150 MG/ML IM SUSP: 150.0000 mg | INTRAMUSCULAR | 3 refills | Status: DC

## 2019-06-24 NOTE — Progress Notes (Signed)
Chief Complaint  Patient presents with  . having heavy periods    noticed it since tubal       28 y.o. [id number] Patient's last menstrual period was 05/27/2019. The current method of family planning is tubal ligation.  Outpatient Encounter Medications as of 06/24/2019  Medication Sig  . medroxyPROGESTERone (DEPO-PROVERA) 150 MG/ML injection Inject 1 mL (150 mg total) into the muscle every 3 (three) months.  . [DISCONTINUED] escitalopram (LEXAPRO) 10 MG tablet Take 1 tablet (10 mg total) by mouth daily.  . [DISCONTINUED] HYDROcodone-acetaminophen (NORCO/VICODIN) 5-325 MG tablet Take 1 tablet by mouth every 6 (six) hours as needed.  . [DISCONTINUED] ibuprofen (ADVIL,MOTRIN) 600 MG tablet Take 1 tablet (600 mg total) by mouth every 6 (six) hours as needed. (Patient not taking: Reported on 10/08/2018)  . [DISCONTINUED] ketorolac (TORADOL) 10 MG tablet Take 1 tablet (10 mg total) by mouth every 8 (eight) hours as needed.  . [DISCONTINUED] ondansetron (ZOFRAN ODT) 8 MG disintegrating tablet Take 1 tablet (8 mg total) by mouth every 8 (eight) hours as needed for nausea or vomiting.  . [DISCONTINUED] Prenat w/o A-FeCb-FeGl-DSS-FA (CITRANATAL RX) 27-1 MG TABS Take daily (Patient taking differently: Take 1 tablet by mouth daily. )   No facility-administered encounter medications on file as of 06/24/2019.     Subjective Pt was on depo provera before tubal ligation Was not having periods on depo Since tubal ligation 09/2018 periods regular but heavy and painful Past Medical History:  Diagnosis Date  . History of chlamydia   . History of trichomoniasis   . Vaginal Pap smear, abnormal     Past Surgical History:  Procedure Laterality Date  . LAPAROSCOPIC TUBAL LIGATION Bilateral 09/30/2018   Procedure: LAPAROSCOPIC BILATERAL TUBAL LIGATION USING ELECTROCAUTERY;  Surgeon: Florian Buff, MD;  Location: [facility];  Service: Gynecology;  Laterality: Bilateral;  . NO PAST SURGERIES    .  THERAPEUTIC ABORTION  03/15/1015    OB History    Gravida  6   Para  4   Term  4   Preterm      AB  2   Living  4     SAB  1   TAB  1   Ectopic      Multiple      Live Births  4           No Known Allergies  Social History   Socioeconomic History  . Marital status: Single    Spouse name: Not on file  . Number of children: Not on file  . Years of education: Not on file  . Highest education level: Not on file  Occupational History  . Not on file  Social Needs  . Financial resource strain: Not on file  . Food insecurity    Worry: Not on file    Inability: Not on file  . Transportation needs    Medical: Not on file    Non-medical: Not on file  Tobacco Use  . Smoking status: Never Smoker  . Smokeless tobacco: Never Used  Substance and Sexual Activity  . Alcohol use: No  . Drug use: No  . Sexual activity: Yes    Birth control/protection: Surgical    Comment: tubal  Lifestyle  . Physical activity    Days per week: Not on file    Minutes per session: Not on file  . Stress: Not on file  Relationships  . Social Herbalist on  phone: Not on file    Gets together: Not on file    Attends religious service: Not on file    Active member of club or organization: Not on file    Attends meetings of clubs or organizations: Not on file    Relationship status: Not on file  Other Topics Concern  . Not on file  Social History Narrative  . Not on file    Family History  Problem Relation Age of Onset  . Asthma Father   . Diabetes Father   . Cancer Paternal Aunt        breast    Medications:       Current Outpatient Medications:  .  medroxyPROGESTERone (DEPO-PROVERA) 150 MG/ML injection, Inject 1 mL (150 mg total) into the muscle every 3 (three) months., Disp: 1 mL, Rfl: 3  Objective Blood pressure 123/75, pulse 69, height 5\' 1"  (1.549 m), weight 146 lb (66.2 kg), last menstrual period 05/27/2019, not currently breastfeeding.  Gen WDWN NAD   Pertinent ROS No burning with urination, frequency or urgency No nausea, vomiting or diarrhea Nor fever chills or other constitutional symptoms   Labs or studies Hemoglobin 11.7    Impression Diagnoses this Encounter::   ICD-10-CM   1. Menorrhagia with regular cycle  N92.0   2. Anemia, unspecified type  D64.9 POCT hemoglobin  3. Dysmenorrhea  N94.6     Established relevant diagnosis(es):   Plan/Recommendations: Meds ordered this encounter  Medications  . medroxyPROGESTERone (DEPO-PROVERA) 150 MG/ML injection    Sig: Inject 1 mL (150 mg total) into the muscle every 3 (three) months.    Dispense:  1 mL    Refill:  3    Labs or Scans Ordered: Orders Placed This Encounter  Procedures  . POCT hemoglobin    Management:: >pt wants to restart the depo provera for cycle management  Follow up Return if symptoms worsen or fail to improve, for make appt to get depo shot with nurse.        Face to face time:  10 minutes  Greater than 50% of the visit time was spent in counseling and coordination of care with the patient.  The summary and outline of the counseling and care coordination is summarized in the note above.   All questions were answered.

## 2019-06-25 ENCOUNTER — Ambulatory Visit: Payer: BC Managed Care – PPO

## 2019-06-29 ENCOUNTER — Encounter: Payer: Self-pay | Admitting: *Deleted

## 2019-07-12 ENCOUNTER — Encounter: Payer: Self-pay | Admitting: Advanced Practice Midwife

## 2019-09-20 ENCOUNTER — Ambulatory Visit: Payer: BC Managed Care – PPO

## 2020-09-11 ENCOUNTER — Ambulatory Visit
Admission: RE | Admit: 2020-09-11 | Discharge: 2020-09-11 | Disposition: A | Discharge status: Home / Self Care | Payer: 59 | Source: Ambulatory Visit | Attending: Urgent Care | Admitting: Urgent Care

## 2020-09-11 ENCOUNTER — Other Ambulatory Visit: Payer: Self-pay

## 2020-09-11 VITALS — BP 109/67 | HR 60 | Temp 98.8°F | Resp 18

## 2020-09-11 DIAGNOSIS — J3489 Other specified disorders of nose and nasal sinuses: Secondary | ICD-10-CM | POA: Diagnosis not present

## 2020-09-11 DIAGNOSIS — R059 Cough, unspecified: Secondary | ICD-10-CM | POA: Diagnosis not present

## 2020-09-11 DIAGNOSIS — J069 Acute upper respiratory infection, unspecified: Secondary | ICD-10-CM | POA: Diagnosis not present

## 2020-09-11 LAB — POCT RAPID STREP A (OFFICE): Rapid Strep A Screen: NEGATIVE

## 2020-09-11 MED ORDER — PROMETHAZINE-DM 6.25-15 MG/5ML PO SYRP: 5.0000 mL | ORAL_SOLUTION | Freq: Every evening | ORAL | 0 refills | Status: DC | PRN

## 2020-09-11 MED ORDER — CETIRIZINE HCL 10 MG PO TABS: 10.0000 mg | ORAL_TABLET | Freq: Every day | ORAL | 0 refills | Status: DC

## 2020-09-11 MED ORDER — PSEUDOEPHEDRINE HCL 60 MG PO TABS: 60.0000 mg | ORAL_TABLET | Freq: Three times a day (TID) | ORAL | 0 refills | Status: DC | PRN

## 2020-09-11 MED ORDER — BENZONATATE 100 MG PO CAPS: 100.0000 mg | ORAL_CAPSULE | Freq: Three times a day (TID) | ORAL | 0 refills | Status: DC | PRN

## 2020-09-11 NOTE — ED Provider Notes (Signed)
[place]   MRN: [id number] DOB: [birth year]  Subjective:   Chelsea Strong is a 29 y.o. female presenting for 2-day history of acute onset sinus congestion, sore throat, sinus headaches, cough.  Patient is not Covid vaccinated.  She does not want to be Covid tested.  She has not had any exposures.  Denies history of asthma, smoking, allergies.  No current facility-administered medications for this encounter.  Current Outpatient Medications:  .  medroxyPROGESTERone (DEPO-PROVERA) 150 MG/ML injection, Inject 1 mL (150 mg total) into the muscle every 3 (three) months., Disp: 1 mL, Rfl: 3   No Known Allergies  Past Medical History:  Diagnosis Date  . History of chlamydia   . History of trichomoniasis   . Vaginal Pap smear, abnormal      Past Surgical History:  Procedure Laterality Date  . LAPAROSCOPIC TUBAL LIGATION Bilateral 09/30/2018   Procedure: LAPAROSCOPIC BILATERAL TUBAL LIGATION USING ELECTROCAUTERY;  Surgeon: Florian Buff, MD;  Location: [facility];  Service: Gynecology;  Laterality: Bilateral;  . NO PAST SURGERIES    . THERAPEUTIC ABORTION  03/15/1015    Family History  Problem Relation Age of Onset  . Asthma Father   . Diabetes Father   . Cancer Paternal Aunt        breast    Social History   Tobacco Use  . Smoking status: Never Smoker  . Smokeless tobacco: Never Used  Vaping Use  . Vaping Use: Never used  Substance Use Topics  . Alcohol use: No  . Drug use: No    ROS   Objective:   Vitals: BP 109/67   Pulse 60   Temp 98.8 F (37.1 C)   Resp 18   LMP 08/21/2020   SpO2 98%   Physical Exam Constitutional:      General: She is not in acute distress.    Appearance: Normal appearance. She is well-developed. She is not ill-appearing, toxic-appearing or diaphoretic.  HENT:     Head: Normocephalic and atraumatic.     Right Ear: Tympanic membrane and ear canal normal. No drainage or tenderness. No middle ear effusion. Tympanic  membrane is not erythematous.     Left Ear: Tympanic membrane and ear canal normal. No drainage or tenderness.  No middle ear effusion. Tympanic membrane is not erythematous.     Nose: Nose normal. No congestion or rhinorrhea.     Mouth/Throat:     Mouth: Mucous membranes are moist. No oral lesions.     Pharynx: Oropharynx is clear. No pharyngeal swelling, oropharyngeal exudate, posterior oropharyngeal erythema or uvula swelling.     Tonsils: No tonsillar exudate or tonsillar abscesses.  Eyes:     Extraocular Movements: Extraocular movements intact.     Right eye: Normal extraocular motion.     Left eye: Normal extraocular motion.     Conjunctiva/sclera: Conjunctivae normal.     Pupils: Pupils are equal, round, and reactive to light.  Cardiovascular:     Rate and Rhythm: Normal rate and regular rhythm.     Pulses: Normal pulses.     Heart sounds: Normal heart sounds. No murmur heard. No friction rub. No gallop.   Pulmonary:     Effort: Pulmonary effort is normal. No respiratory distress.     Breath sounds: Normal breath sounds. No stridor. No wheezing, rhonchi or rales.  Musculoskeletal:     Cervical back: Normal range of motion and neck supple.  Lymphadenopathy:     Cervical: No cervical adenopathy.  Skin:  General: Skin is warm and dry.     Findings: No rash.  Neurological:     General: No focal deficit present.     Mental Status: She is alert and oriented to person, place, and time.  Psychiatric:        Mood and Affect: Mood normal.        Behavior: Behavior normal.        Thought Content: Thought content normal.     Results for orders placed or performed during the hospital encounter of 09/11/20 (from the past 24 hour(s))  POCT rapid strep A     Status: None   Collection Time: 09/11/20  3:12 PM  Result Value Ref Range   Rapid Strep A Screen Negative Negative    Assessment and Plan :   PDMP not reviewed this encounter.  1. Viral upper respiratory infection   2.  Sinus drainage   3. Cough     Will manage for viral illness such as viral URI, viral syndrome, viral rhinitis, COVID-19. Counseled patient on nature of COVID-19 including modes of transmission, diagnostic testing, management and supportive care.  Offered scripts for symptomatic relief. COVID 19 testing, strep culture are pending. Counseled patient on potential for adverse effects with medications prescribed/recommended today, ER and return-to-clinic precautions discussed, patient verbalized understanding.     Jaynee Eagles, PA-C 09/11/20 1556

## 2020-09-11 NOTE — Discharge Instructions (Addendum)
We will manage this as a viral upper respiratory infection. For sore throat or cough try using a honey-based tea. Use 3 teaspoons of honey with juice squeezed from half lemon. Place shaved pieces of ginger into 1/2-1 cup of water and warm over stove top. Then mix the ingredients and repeat every 4 hours as needed. Please take Tylenol 500mg -650mg  every 6 hours for aches and pains, fevers. Hydrate very well with at least 2 liters of water. Eat light meals such as soups to replenish electrolytes and soft fruits, veggies. Start an antihistamine like Zyrtec, Allegra or Claritin for postnasal drainage, sinus congestion.  You can take this together with pseudoephedrine (Sudafed) at a dose of 60 mg 2-3 times a day as needed for the same kind of congestion.

## 2020-09-11 NOTE — ED Triage Notes (Signed)
Pt presents with sore throat and nasal  congestion that began a couple days ago

## 2020-09-14 LAB — CULTURE, GROUP A STREP (THRC)

## 2021-04-16 ENCOUNTER — Other Ambulatory Visit: Payer: Self-pay

## 2021-04-16 ENCOUNTER — Ambulatory Visit
Admission: RE | Admit: 2021-04-16 | Discharge: 2021-04-16 | Disposition: A | Discharge status: Home / Self Care | Payer: 59 | Source: Ambulatory Visit | Attending: Family Medicine | Admitting: Family Medicine

## 2021-04-16 VITALS — BP 137/99 | HR 63 | Temp 99.0°F | Resp 18

## 2021-04-16 DIAGNOSIS — D367 Benign neoplasm of other specified sites: Secondary | ICD-10-CM

## 2021-04-16 DIAGNOSIS — G44209 Tension-type headache, unspecified, not intractable: Secondary | ICD-10-CM | POA: Diagnosis not present

## 2021-04-16 MED ORDER — BUTALBITAL-APAP-CAFFEINE 50-325-40 MG PO TABS: 1.0000 | ORAL_TABLET | Freq: Two times a day (BID) | ORAL | 0 refills | Status: AC | PRN

## 2021-04-16 NOTE — ED Provider Notes (Signed)
[facility]    CSN: [id number] Arrival date & time: 04/16/21  1103      History   Chief Complaint Chief Complaint  Patient presents with   Headache   Cyst    HPI Chelsea Strong is a 30 y.o. female.   HPI Patient with a known history of migraines presents today with headache ongoing for 3 to 4 days.  Reports headache pain has been as high as 10/10.  She endorses sensitivity to light.  She has a small cystic lesion on her scalp that has been present for several years. She reports the mass is tender and has changed positions over time. She was concern that cyst may be the source of recurrent headaches. She doesn't wear eye glasses and denies any visual changes.   Past Medical History:  Diagnosis Date   History of chlamydia    History of trichomoniasis    Vaginal Pap smear, abnormal     Patient Active Problem List   Diagnosis Date Noted   Postpartum depression 08/25/2018   Vaginal delivery 07/21/2018   Normal labor 07/19/2018   Abnormal Pap smear of cervix 02/02/2018   Supervision of normal pregnancy 01/08/2018   History of chlamydia 12/01/2012    Past Surgical History:  Procedure Laterality Date   LAPAROSCOPIC TUBAL LIGATION Bilateral 09/30/2018   Procedure: LAPAROSCOPIC BILATERAL TUBAL LIGATION USING ELECTROCAUTERY;  Surgeon: Florian Buff, MD;  Location: [facility];  Service: Gynecology;  Laterality: Bilateral;   NO PAST SURGERIES     THERAPEUTIC ABORTION  03/15/1015    OB History     Gravida  6   Para  4   Term  4   Preterm      AB  2   Living  4      SAB  1   IAB  1   Ectopic      Multiple      Live Births  4            Home Medications    Prior to Admission medications   Medication Sig Start Date End Date Taking? Authorizing Provider  benzonatate (TESSALON) 100 MG capsule Take 1-2 capsules (100-200 mg total) by mouth 3 (three) times daily as needed. 09/11/20   Jaynee Eagles, PA-C  cetirizine (ZYRTEC ALLERGY) 10 MG tablet  Take 1 tablet (10 mg total) by mouth daily. 09/11/20   Jaynee Eagles, PA-C  medroxyPROGESTERone (DEPO-PROVERA) 150 MG/ML injection Inject 1 mL (150 mg total) into the muscle every 3 (three) months. 06/24/19   Florian Buff, MD  promethazine-dextromethorphan (PROMETHAZINE-DM) 6.25-15 MG/5ML syrup Take 5 mLs by mouth at bedtime as needed for cough. 09/11/20   Jaynee Eagles, PA-C  pseudoephedrine (SUDAFED) 60 MG tablet Take 1 tablet (60 mg total) by mouth every 8 (eight) hours as needed for congestion. 09/11/20   Jaynee Eagles, PA-C    Family History Family History  Problem Relation Age of Onset   Asthma Father    Diabetes Father    Cancer Paternal Aunt        breast    Social History Social History   Tobacco Use   Smoking status: Never   Smokeless tobacco: Never  Vaping Use   Vaping Use: Never used  Substance Use Topics   Alcohol use: No   Drug use: No     Allergies   Patient has no known allergies.   Review of Systems Review of Systems Pertinent negatives listed in HPI   Physical  Exam Triage Vital Signs ED Triage Vitals [04/16/21 1120]  Enc Vitals Group     BP (!) 137/99     Pulse Rate 63     Resp 18     Temp 99 F (37.2 C)     Temp Source Oral     SpO2 98 %     Weight      Height      Head Circumference      Peak Flow      Pain Score 5     Pain Loc      Pain Edu?      Excl. in [place]?    No data found.  Updated Vital Signs BP (!) 137/99   Pulse 63   Temp 99 F (37.2 C) (Oral)   Resp 18   SpO2 98%   Visual Acuity Right Eye Distance:   Left Eye Distance:   Bilateral Distance:    Right Eye Near:   Left Eye Near:    Bilateral Near:     Physical Exam General appearance: alert, well developed, well nourished, cooperative  Head: Normocephalic, without obvious abnormality, atraumatic Eyes: PERRLA, EOM intact, sclera/conjunctivae WNL Respiratory: Respirations even and unlabored, normal respiratory rate Heart: rate and rhythm normal. No gallop or murmurs  noted on exam  Abdomen: BS +, no distention, no rebound tenderness, or no mass Extremities: No gross deformities Skin: Skin color, texture, turgor normal. No rashes seen  Psych: Appropriate mood and affect. Neurologic: GCS 15, normal coordination, normal gait, normal sensation, no focal deficits present on exam UC Treatments / Results  Labs (all labs ordered are listed, but only abnormal results are displayed) Labs Reviewed - No data to display  EKG   Radiology No results found.  Procedures Procedures (including critical care time)  Medications Ordered in UC Medications - No data to display  Initial Impression / Assessment and Plan / UC Course  I have reviewed the triage vital signs and the nursing notes. Pertinent labs & imaging results that were available during my care of the patient were reviewed by me and considered in my medical decision making (see chart for details).  Patient presents today with headache and a chronic dermoid cyst of the head. Will trial Fioricet for management of recurrent headaches. Given cystis chronic patient likely warrants further evaluation including imaging to further diagnose the etiology of cystic lesion on scalp. Was able to get patient established with a new primary care provider and appointment scheduled at [facility]. Patient will follow-up for further management and work-up of headaches and dermoid cyst.  Return precautions given if symptoms do not improve prior to PCP appointment.  If headache becomes severe go to the emergency department. Final Clinical Impressions(s) / UC Diagnoses   Final diagnoses:  Acute non intractable tension-type headache  Dermoid cyst of head   Discharge Instructions   None    ED Prescriptions     Medication Sig Dispense Auth. Provider   butalbital-acetaminophen-caffeine (FIORICET) 50-325-40 MG tablet Take 1 tablet by mouth 2 (two) times daily as needed for headache. 20 tablet Scot Jun, FNP      PDMP not reviewed this encounter.   Scot Jun, FNP 04/16/21 1216

## 2021-04-16 NOTE — ED Triage Notes (Signed)
Pt here with recurring migraines that last 3-4 days at a time and throbbing in nature. 10/10 pain, endorses photosensitivity. Headaches are primarily on the left side of head and sometimes behind eyes when looking at computer screen for long periods. 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 Also c/o cyst on right anterior scalp that is starting to become painful after many years.

## 2021-04-19 ENCOUNTER — Emergency Department (HOSPITAL_COMMUNITY): Admission: EM | Admit: 2021-04-19 | Payer: 59 | Source: Home / Self Care

## 2021-04-19 ENCOUNTER — Other Ambulatory Visit: Payer: Self-pay

## 2021-04-19 ENCOUNTER — Emergency Department (HOSPITAL_COMMUNITY)
Admission: EM | Admit: 2021-04-19 | Discharge: 2021-04-20 | Disposition: A | Discharge status: Home / Self Care | Payer: 59 | Attending: Emergency Medicine | Admitting: Emergency Medicine

## 2021-04-19 ENCOUNTER — Encounter (HOSPITAL_COMMUNITY): Payer: Self-pay | Admitting: *Deleted

## 2021-04-19 ENCOUNTER — Emergency Department (HOSPITAL_COMMUNITY): Payer: 59

## 2021-04-19 DIAGNOSIS — R55 Syncope and collapse: Secondary | ICD-10-CM | POA: Insufficient documentation

## 2021-04-19 DIAGNOSIS — R519 Headache, unspecified: Secondary | ICD-10-CM | POA: Diagnosis not present

## 2021-04-19 DIAGNOSIS — R079 Chest pain, unspecified: Secondary | ICD-10-CM | POA: Diagnosis not present

## 2021-04-19 DIAGNOSIS — I499 Cardiac arrhythmia, unspecified: Secondary | ICD-10-CM | POA: Diagnosis not present

## 2021-04-19 DIAGNOSIS — R531 Weakness: Secondary | ICD-10-CM | POA: Diagnosis not present

## 2021-04-19 LAB — CBC WITH DIFFERENTIAL/PLATELET
Abs Immature Granulocytes: 0.01 10*3/uL (ref 0.00–0.07)
Basophils Absolute: 0.1 10*3/uL (ref 0.0–0.1)
Basophils Relative: 1 %
Eosinophils Absolute: 0 10*3/uL (ref 0.0–0.5)
Eosinophils Relative: 0 %
HCT: 38.3 % (ref 36.0–46.0)
Hemoglobin: 12.3 g/dL (ref 12.0–15.0)
Immature Granulocytes: 0 %
Lymphocytes Relative: 51 %
Lymphs Abs: 2.9 10*3/uL (ref 0.7–4.0)
MCH: 28.1 pg (ref 26.0–34.0)
MCHC: 32.1 g/dL (ref 30.0–36.0)
MCV: 87.4 fL (ref 80.0–100.0)
Monocytes Absolute: 0.5 10*3/uL (ref 0.1–1.0)
Monocytes Relative: 9 %
Neutro Abs: 2.2 10*3/uL (ref 1.7–7.7)
Neutrophils Relative %: 39 %
Platelets: 291 10*3/uL (ref 150–400)
RBC: 4.38 MIL/uL (ref 3.87–5.11)
RDW: 11.5 % (ref 11.5–15.5)
WBC: 5.6 10*3/uL (ref 4.0–10.5)
nRBC: 0 % (ref 0.0–0.2)

## 2021-04-19 LAB — BASIC METABOLIC PANEL
Anion gap: 8 (ref 5–15)
BUN: 12 mg/dL (ref 6–20)
CO2: 25 mmol/L (ref 22–32)
Calcium: 9.2 mg/dL (ref 8.9–10.3)
Chloride: 107 mmol/L (ref 98–111)
Creatinine, Ser: 1.08 mg/dL — ABNORMAL HIGH (ref 0.44–1.00)
GFR, Estimated: >60 mL/min (ref >60)
Glucose, Bld: 88 mg/dL (ref 70–99)
Potassium: 4.2 mmol/L (ref 3.5–5.1)
Sodium: 140 mmol/L (ref 135–145)

## 2021-04-19 LAB — PREGNANCY, URINE: Preg Test, Ur: NEGATIVE

## 2021-04-19 LAB — CBG MONITORING, ED: Glucose-Capillary: 71 mg/dL (ref 70–99)

## 2021-04-19 NOTE — ED Triage Notes (Addendum)
Pt reports having a syncopal episode today. She reports intermittent sharp chest pains over past several days. No distress is noted at triage.

## 2021-04-19 NOTE — Discharge Instructions (Addendum)
Eat and drink as well as you can for the next 48 hours.  Please follow-up with your family doctor in the office.  Return for worsening or persistent headache or chest pain.

## 2021-04-19 NOTE — ED Provider Notes (Signed)
Emergency Medicine Provider Triage Evaluation Note  Chelsea Strong , a 30 y.o. female  was evaluated in triage.  Pt complains of generalized weakness, started today, he also been having some intermittent chest pain, no chest pain today, she denies recent sick contacts, not vaccinated COVID-19, denies nasal congestion, sore throat, cough, general body aches.  She also notes that she has a syncopal episode today, states she was coming inside, felt lightheaded fell to the ground.  She has no headaches, no change in vision, paresthesias or weakness in the upper and/or lower extremities.  Review of Systems  Positive: Weakness, syncopal episodes Negative: Chest pain, shortness of breath  Physical Exam  BP (!) 121/98 (BP Location: Left Arm)   Pulse 62   Temp 98.4 F (36.9 C) (Oral)   Resp 18   LMP 04/17/2021   SpO2 100%  Gen:   Awake, no distress   Resp:  Normal effort  MSK:   Moves extremities without difficulty  Other:  No facial asymmetry, no difficulty word finding, no word slurring, no unilateral weakness.  Medical Decision Making  Medically screening exam initiated at 2:35 PM.  Appropriate orders placed.  Roberto Scales was informed that the remainder of the evaluation will be completed by another provider, this initial triage assessment does not replace that evaluation, and the importance of remaining in the ED until their evaluation is complete.  Presents with generalized weakness.  Patient need further work-up.   Marcello Fennel, PA-C 04/19/21 1448    Gareth Morgan, MD 04/19/21 [phone]

## 2021-04-19 NOTE — ED Provider Notes (Signed)
[facility] EMERGENCY DEPARTMENT Provider Note   CSN: [id number] Arrival date & time: 04/19/21  1417     History Chief Complaint  Patient presents with   Loss of Consciousness   Chest Pain    Chelsea Strong is a 30 y.o. female.  30 yo F with a chief complaints of a loss of consciousness.  The patient was at work and was not feeling well got up to try and ambulate and suddenly felt worse and then she ended up passing out.  Feels much better afterwards.  Is felt a little bit under the weather for the past couple days.  Denies any specific symptoms.  Denies cough congestion or fever denies nausea vomiting or diarrhea feels like she has been eating and drinking normally.  Has heavy menstrual cycles at baseline but no recent change.  Has headaches off and on but none prior to the event.  Has had chest pain off and on but also not today.  The history is provided by the patient.  Loss of Consciousness Episode history:  Single Most recent episode:  Today Duration:  2 seconds Timing:  Constant Progression:  Resolved Chronicity:  New Witnessed: no   Relieved by:  Nothing Worsened by:  Nothing Ineffective treatments:  None tried Associated symptoms: chest pain and headaches   Associated symptoms: no dizziness, no fever, no nausea, no palpitations, no shortness of breath and no vomiting   Chest Pain Associated symptoms: headache and syncope   Associated symptoms: no dizziness, no fever, no nausea, no palpitations, no shortness of breath and no vomiting       Past Medical History:  Diagnosis Date   History of chlamydia    History of trichomoniasis    Vaginal Pap smear, abnormal     Patient Active Problem List   Diagnosis Date Noted   Postpartum depression 08/25/2018   Vaginal delivery 07/21/2018   Normal labor 07/19/2018   Abnormal Pap smear of cervix 02/02/2018   Supervision of normal pregnancy 01/08/2018   History of chlamydia 12/01/2012    Past  Surgical History:  Procedure Laterality Date   LAPAROSCOPIC TUBAL LIGATION Bilateral 09/30/2018   Procedure: LAPAROSCOPIC BILATERAL TUBAL LIGATION USING ELECTROCAUTERY;  Surgeon: Florian Buff, MD;  Location: [facility];  Service: Gynecology;  Laterality: Bilateral;   NO PAST SURGERIES     THERAPEUTIC ABORTION  03/15/1015     OB History     Gravida  6   Para  4   Term  4   Preterm      AB  2   Living  4      SAB  1   IAB  1   Ectopic      Multiple      Live Births  4           Family History  Problem Relation Age of Onset   Asthma Father    Diabetes Father    Cancer Paternal Aunt        breast    Social History   Tobacco Use   Smoking status: Never   Smokeless tobacco: Never  Vaping Use   Vaping Use: Never used  Substance Use Topics   Alcohol use: No   Drug use: No    Home Medications Prior to Admission medications   Medication Sig Start Date End Date Taking? Authorizing Provider  benzonatate (TESSALON) 100 MG capsule Take 1-2 capsules (100-200 mg total) by mouth 3 (three) times daily  as needed. 09/11/20   Jaynee Eagles, PA-C  butalbital-acetaminophen-caffeine (FIORICET) [phone] MG tablet Take 1 tablet by mouth 2 (two) times daily as needed for headache. 04/16/21 04/16/22  Scot Jun, FNP  cetirizine (ZYRTEC ALLERGY) 10 MG tablet Take 1 tablet (10 mg total) by mouth daily. 09/11/20   Jaynee Eagles, PA-C  medroxyPROGESTERone (DEPO-PROVERA) 150 MG/ML injection Inject 1 mL (150 mg total) into the muscle every 3 (three) months. 06/24/19   Florian Buff, MD  promethazine-dextromethorphan (PROMETHAZINE-DM) 6.25-15 MG/5ML syrup Take 5 mLs by mouth at bedtime as needed for cough. 09/11/20   Jaynee Eagles, PA-C  pseudoephedrine (SUDAFED) 60 MG tablet Take 1 tablet (60 mg total) by mouth every 8 (eight) hours as needed for congestion. 09/11/20   Jaynee Eagles, PA-C    Allergies    Patient has no known allergies.  Review of Systems   Review of Systems   Constitutional:  Negative for chills and fever.  HENT:  Negative for congestion and rhinorrhea.   Eyes:  Negative for redness and visual disturbance.  Respiratory:  Negative for shortness of breath and wheezing.   Cardiovascular:  Positive for chest pain and syncope. Negative for palpitations.  Gastrointestinal:  Negative for nausea and vomiting.  Genitourinary:  Negative for dysuria and urgency.  Musculoskeletal:  Negative for arthralgias and myalgias.  Skin:  Negative for pallor and wound.  Neurological:  Positive for syncope and headaches. Negative for dizziness.   Physical Exam Updated Vital Signs BP 125/69 (BP Location: Left Arm)   Pulse 73   Temp 98.5 F (36.9 C) (Oral)   Resp 14   Ht '5\' 1"'$  (1.549 m)   Wt 59.9 kg   LMP 04/17/2021   SpO2 100%   BMI 24.94 kg/m   Physical Exam Vitals and nursing note reviewed.  Constitutional:      General: She is not in acute distress.    Appearance: She is well-developed. She is not diaphoretic.  HENT:     Head: Normocephalic and atraumatic.  Eyes:     Pupils: Pupils are equal, round, and reactive to light.  Cardiovascular:     Rate and Rhythm: Normal rate and regular rhythm.     Heart sounds: No murmur heard.   No friction rub. No gallop.  Pulmonary:     Effort: Pulmonary effort is normal.     Breath sounds: No wheezing or rales.  Abdominal:     General: There is no distension.     Palpations: Abdomen is soft.     Tenderness: There is no abdominal tenderness.  Musculoskeletal:        General: No tenderness.     Cervical back: Normal range of motion and neck supple.  Skin:    General: Skin is warm and dry.  Neurological:     Mental Status: She is alert and oriented to person, place, and time.  Psychiatric:        Behavior: Behavior normal.    ED Results / Procedures / Treatments   Labs (all labs ordered are listed, but only abnormal results are displayed) Labs Reviewed  BASIC METABOLIC PANEL - Abnormal; Notable for  the following components:      Result Value   Creatinine, Ser 1.08 (*)    All other components within normal limits  RESP PANEL BY RT-PCR (FLU A&B, COVID) ARPGX2  CBC WITH DIFFERENTIAL/PLATELET  PREGNANCY, URINE  CBG MONITORING, ED    EKG EKG Interpretation  Date/Time:  Thursday April 19 2021 14:24:57 EDT  Ventricular Rate:  64 PR Interval:  128 QRS Duration: 86 QT Interval:  410 QTC Calculation: 422 R Axis:   80 Text Interpretation: Normal sinus rhythm with sinus arrhythmia Normal ECG no wpw, prolonged qt or brugada No old tracing to compare Confirmed by Deno Etienne [phone]) on 04/19/2021 11:04:17 PM  Radiology DG Chest 2 View  Result Date: 04/19/2021 CLINICAL DATA:  Weakness, chest pain EXAM: CHEST - 2 VIEW COMPARISON:  12/06/2015 FINDINGS: The heart size and mediastinal contours are within normal limits. Both lungs are clear. The visualized skeletal structures are unremarkable. IMPRESSION: Normal study. Electronically Signed   By: Rolm Baptise M.D.   On: 04/19/2021 15:25    Procedures Procedures   Medications Ordered in ED Medications - No data to display  ED Course  I have reviewed the triage vital signs and the nursing notes.  Pertinent labs & imaging results that were available during my care of the patient were reviewed by me and considered in my medical decision making (see chart for details).    MDM Rules/Calculators/A&P                           26 y oF with a chief complaint of a syncopal event.  Sounds vasovagal by history.  Back to baseline now.  Has been feeling a little bit crummy over the past week but denies any other specific symptoms.  Headaches off and on but no recent changes chest pain off and on but not currently.  Chest x-ray viewed by me without focal infiltrate EKG without concerning finding blood sugar is normal no significant electrolyte abnormality.  No significant anemia.  Will discharge home.  PCP follow-up.  12:29 AM:  I have discussed the  diagnosis/risks/treatment options with the patient and believe the pt to be eligible for discharge home to follow-up with PCP. We also discussed returning to the ED immediately if new or worsening sx occur. We discussed the sx which are most concerning (e.g., sudden worsening pain, fever, inability to tolerate by mouth) that necessitate immediate return. Medications administered to the patient during their visit and any new prescriptions provided to the patient are listed below.  Medications given during this visit Medications - No data to display   The patient appears reasonably screen and/or stabilized for discharge and I doubt any other medical condition or other [facility] requiring further screening, evaluation, or treatment in the ED at this time prior to discharge.   Final Clinical Impression(s) / ED Diagnoses Final diagnoses:  Syncope and collapse    Rx / DC Orders ED Discharge Orders     None        Deno Etienne, DO 04/20/21 0030

## 2021-04-23 ENCOUNTER — Telehealth: Payer: Self-pay

## 2021-04-23 NOTE — Telephone Encounter (Signed)
Transition Care Management Unsuccessful Follow-up Telephone Call  Date of discharge and from where:  04/20/2021-[facility]  Attempts:  1st Attempt  Reason for unsuccessful TCM follow-up call:  Left voice message

## 2021-04-24 NOTE — Telephone Encounter (Signed)
Transition Care Management Unsuccessful Follow-up Telephone Call  Date of discharge and from where:  04/20/2021-[facility]   Attempts:  2nd Attempt  Reason for unsuccessful TCM follow-up call:  Left voice message

## 2021-04-25 NOTE — Telephone Encounter (Signed)
Transition Care Management Unsuccessful Follow-up Telephone Call  Date of discharge and from where:  04/20/2021-[facility]  Attempts:  3rd Attempt  Reason for unsuccessful TCM follow-up call:  Voice mail full

## 2021-05-02 ENCOUNTER — Other Ambulatory Visit: Payer: Self-pay

## 2021-05-02 ENCOUNTER — Ambulatory Visit (INDEPENDENT_AMBULATORY_CARE_PROVIDER_SITE_OTHER): Payer: 59 | Admitting: Family Medicine

## 2021-05-02 ENCOUNTER — Encounter: Payer: Self-pay | Admitting: Family Medicine

## 2021-05-02 VITALS — BP 113/74 | HR 58 | Temp 98.2°F | Ht 61.0 in | Wt 145.2 lb

## 2021-05-02 DIAGNOSIS — F419 Anxiety disorder, unspecified: Secondary | ICD-10-CM | POA: Diagnosis not present

## 2021-05-02 DIAGNOSIS — F320 Major depressive disorder, single episode, mild: Secondary | ICD-10-CM | POA: Diagnosis not present

## 2021-05-02 DIAGNOSIS — L720 Epidermal cyst: Secondary | ICD-10-CM | POA: Diagnosis not present

## 2021-05-02 DIAGNOSIS — G43009 Migraine without aura, not intractable, without status migrainosus: Secondary | ICD-10-CM | POA: Diagnosis not present

## 2021-05-02 DIAGNOSIS — Z7689 Persons encountering health services in other specified circumstances: Secondary | ICD-10-CM | POA: Diagnosis not present

## 2021-05-02 MED ORDER — ONDANSETRON HCL 4 MG PO TABS: 4.0000 mg | ORAL_TABLET | Freq: Three times a day (TID) | ORAL | 0 refills | Status: DC | PRN

## 2021-05-02 MED ORDER — SUMATRIPTAN SUCCINATE 25 MG PO TABS: ORAL_TABLET | ORAL | 1 refills | Status: DC

## 2021-05-02 NOTE — Patient Instructions (Signed)
Migraine Headache A migraine headache is an intense, throbbing pain on one side or both sides of the head. Migraine headaches may also cause other symptoms, such as nausea, vomiting, and sensitivity to light and noise. A migraine headache can last from 4 hours to 3 days. Talk with your doctor about what things may bring on (trigger) your migraine headaches. What are the causes? The exact cause of this condition is not known. However, a migraine may be caused when nerves in the brain become irritated and release chemicals that cause inflammation of blood vessels. This inflammation causes pain. This condition may be triggered or caused by: Drinking alcohol. Smoking. Taking medicines, such as: Medicine used to treat chest pain (nitroglycerin). Birth control pills. Estrogen. Certain blood pressure medicines. Eating or drinking products that contain nitrates, glutamate, aspartame, or tyramine. Aged cheeses, chocolate, or caffeine may also be triggers. Doing physical activity. Other things that may trigger a migraine headache include: Menstruation. Pregnancy. Hunger. Stress. Lack of sleep or too much sleep. Weather changes. Fatigue. What increases the risk? The following factors may make you more likely to experience migraine headaches: Being a certain age. This condition is more common in people who are 25-55 years old. Being female. Having a family history of migraine headaches. Being Caucasian. Having a mental health condition, such as depression or anxiety. Being obese. What are the signs or symptoms? The main symptom of this condition is pulsating or throbbing pain. This pain may: Happen in any area of the head, such as on one side or both sides. Interfere with daily activities. Get worse with physical activity. Get worse with exposure to bright lights or loud noises. Other symptoms may include: Nausea. Vomiting. Dizziness. General sensitivity to bright lights, loud noises, or  smells. Before you get a migraine headache, you may get warning signs (an aura). An aura may include: Seeing flashing lights or having blind spots. Seeing bright spots, halos, or zigzag lines. Having tunnel vision or blurred vision. Having numbness or a tingling feeling. Having trouble talking. Having muscle weakness. Some people have symptoms after a migraine headache (postdromal phase), such as: Feeling tired. Difficulty concentrating. How is this diagnosed? A migraine headache can be diagnosed based on: Your symptoms. A physical exam. Tests, such as: CT scan or an MRI of the head. These imaging tests can help rule out other causes of headaches. Taking fluid from the spine (lumbar puncture) and analyzing it (cerebrospinal fluid analysis, or CSF analysis). How is this treated? This condition may be treated with medicines that: Relieve pain. Relieve nausea. Prevent migraine headaches. Treatment for this condition may also include: Acupuncture. Lifestyle changes like avoiding foods that trigger migraine headaches. Biofeedback. Cognitive behavioral therapy. Follow these instructions at home: Medicines Take over-the-counter and prescription medicines only as told by your health care provider. Ask your health care provider if the medicine prescribed to you: Requires you to avoid driving or using heavy machinery. Can cause constipation. You may need to take these actions to prevent or treat constipation: Drink enough fluid to keep your urine pale yellow. Take over-the-counter or prescription medicines. Eat foods that are high in fiber, such as beans, whole grains, and fresh fruits and vegetables. Limit foods that are high in fat and processed sugars, such as fried or sweet foods. Lifestyle Do not drink alcohol. Do not use any products that contain nicotine or tobacco, such as cigarettes, e-cigarettes, and chewing tobacco. If you need help quitting, ask your health care  provider. Get at least 8   hours of sleep every night. Find ways to manage stress, such as meditation, deep breathing, or yoga. General instructions   Keep a journal to find out what may trigger your migraine headaches. For example, write down: What you eat and drink. How much sleep you get. Any change to your diet or medicines. If you have a migraine headache: Avoid things that make your symptoms worse, such as bright lights. It may help to lie down in a dark, quiet room. Do not drive or use heavy machinery. Ask your health care provider what activities are safe for you while you are experiencing symptoms. Keep all follow-up visits as told by your health care provider. This is important. Contact a health care provider if: You develop symptoms that are different or more severe than your usual migraine headache symptoms. You have more than 15 headache days in one month. Get help right away if: Your migraine headache becomes severe. Your migraine headache lasts longer than 72 hours. You have a fever. You have a stiff neck. You have vision loss. Your muscles feel weak or like you cannot control them. You start to lose your balance often. You have trouble walking. You faint. You have a seizure. Summary A migraine headache is an intense, throbbing pain on one side or both sides of the head. Migraines may also cause other symptoms, such as nausea, vomiting, and sensitivity to light and noise. This condition may be treated with medicines and lifestyle changes. You may also need to avoid certain things that trigger a migraine headache. Keep a journal to find out what may trigger your migraine headaches. Contact your health care provider if you have more than 15 headache days in a month or you develop symptoms that are different or more severe than your usual migraine headache symptoms. This information is not intended to replace advice given to you by your health care provider. Make sure you  discuss any questions you have with your health care provider. Document Revised: 01/01/2019 Document Reviewed: 10/22/2018 Elsevier Patient Education  2022 Elsevier Inc.  

## 2021-05-02 NOTE — Progress Notes (Addendum)
New Patient Office Visit  Subjective:  Patient ID: Chelsea Strong, female    DOB: 03/18/1991  Age: 30 y.o. MRN: [id number]  CC:  Chief Complaint  Patient presents with   New Patient (Initial Visit)    HPI Chelsea Strong presents to establish care.   Headaches Chelsea Strong reports headaches for the last 4-5 months that last for 2-3 days. She reports bilateral frontal headaches. Sometimes they are only on the right side. She feels a lot of pressure behind her eyes with her headaches. She also reports nausea, vomiting, and sensitivity to light and noise with the headaches. She has tried tylenol without improvement. She reports rest is the most helpful. Denies weakness, changes in gait, falls, dizziness, or visual disturbances.   2. Scalp cyst She has a small cyst on the lower right side of her scalp. This has been present for at least 10 years. She does feels like it has moved up in position but has not notice a difference in size. It is sometimes tender. Denies drainage from this.   3. Depression/anxiety Reports a history of PPD. She did take medication at the time but really didn't like how it made her feel. She is not currently interested in treatment at this time.   Depression screen [facility] 2/9 05/02/2021 01/08/2018  Decreased Interest 2 1  Down, Depressed, Hopeless 1 0  PHQ - 2 Score 3 1  Altered sleeping 2 1  Tired, decreased energy 1 1  Change in appetite 1 1  Feeling bad or failure about yourself  1 0  Trouble concentrating 0 0  Moving slowly or fidgety/restless 1 0  Suicidal thoughts 0 0  PHQ-9 Score 9 4  Difficult doing work/chores Somewhat difficult -   GAD 7 : Generalized Anxiety Score 05/02/2021  Nervous, Anxious, on Edge 1  Control/stop worrying 1  Worry too much - different things 1  Trouble relaxing 1  Restless 0  Easily annoyed or irritable 2  Afraid - awful might happen 0  Total GAD 7 Score 6  Anxiety Difficulty Somewhat difficult      Past Medical  History:  Diagnosis Date   History of chlamydia    History of trichomoniasis    Vaginal Pap smear, abnormal     Past Surgical History:  Procedure Laterality Date   LAPAROSCOPIC TUBAL LIGATION Bilateral 09/30/2018   Procedure: LAPAROSCOPIC BILATERAL TUBAL LIGATION USING ELECTROCAUTERY;  Surgeon: Florian Buff, MD;  Location: [facility];  Service: Gynecology;  Laterality: Bilateral;   THERAPEUTIC ABORTION  03/15/1015    Family History  Problem Relation Age of Onset   Cataracts Mother    Hypertension Father    Asthma Father    Cancer Paternal Aunt        breast    Social History   Socioeconomic History   Marital status: Single    Spouse name: Not on file   Number of children: 4   Years of education: 12   Highest education level: High school graduate  Occupational History   Not on file  Tobacco Use   Smoking status: Never   Smokeless tobacco: Never  Vaping Use   Vaping Use: Never used  Substance and Sexual Activity   Alcohol use: No   Drug use: Yes    Frequency: 7.0 times per week    Types: Marijuana   Sexual activity: Yes    Birth control/protection: Surgical    Comment: tubal  Other Topics Concern   Not on file  Social History Narrative   Not on file   Social Determinants of Health   Financial Resource Strain: Not on file  Food Insecurity: Not on file  Transportation Needs: Not on file  Physical Activity: Not on file  Stress: Not on file  Social Connections: Not on file  Intimate Partner Violence: Not on file    ROS Review of Systems As per HPI.   Objective:   Today's Vitals: BP 113/74   Pulse (!) 58   Temp 98.2 F (36.8 C) (Temporal)   Ht '5\' 1"'$  (1.549 m)   Wt 145 lb 4 oz (65.9 kg)   LMP 04/17/2021   BMI 27.44 kg/m   Physical Exam Vitals and nursing note reviewed.  Constitutional:      General: She is not in acute distress.    Appearance: Normal appearance. She is not ill-appearing, toxic-appearing or diaphoretic.  HENT:     Head:       Comments: Small pea size moveable cyst on lower right scalp. No signs of infection.  Cardiovascular:     Rate and Rhythm: Normal rate and regular rhythm.     Heart sounds: Normal heart sounds. No murmur heard. Pulmonary:     Effort: Pulmonary effort is normal. No respiratory distress.     Breath sounds: Normal breath sounds.  Musculoskeletal:     Right lower leg: No edema.     Left lower leg: No edema.  Skin:    General: Skin is warm and dry.  Neurological:     General: No focal deficit present.     Mental Status: She is alert and oriented to person, place, and time.     Motor: No weakness.     Gait: Gait normal.  Psychiatric:        Mood and Affect: Mood normal.        Behavior: Behavior normal.    Assessment & Plan:   Chelsea Strong was seen today for new patient (initial visit).  Diagnoses and all orders for this visit:  Migraine without aura and without status migrainosus, not intractable History consistent with migraines. Imitrex ordered as below. Zofran as needed for associated nausea.  -     SUMAtriptan (IMITREX) 25 MG tablet; Take 1 tablet when headache occurs. May repeat in 2 hours if headache persists or recurs. -     ondansetron (ZOFRAN) 4 MG tablet; Take 1 tablet (4 mg total) by mouth every 8 (eight) hours as needed for nausea or vomiting.  Epidermoid cyst of skin of scalp Appear benign, referral to dermatology placed.  -     Ambulatory referral to Dermatology  Current mild episode of major depressive disorder without prior episode (HCC) PHQ score of 9 today. Declined treatment today. Denies SI.   Anxiety GAD score of 6 today, declined treatment.   Encounter to establish care Reviewed available records.    Follow-up: Return in about 6 weeks (around 06/13/2021) for CPE with pap.   The patient indicates understanding of these issues and agrees with the plan.  Gwenlyn Perking, FNP

## 2021-05-14 ENCOUNTER — Telehealth: Payer: Self-pay | Admitting: *Deleted

## 2021-05-14 DIAGNOSIS — G43009 Migraine without aura, not intractable, without status migrainosus: Secondary | ICD-10-CM

## 2021-05-14 NOTE — Telephone Encounter (Signed)
Key: [id number]  SUMAtriptan Succinate '25MG'$  tablets  Clinical questions answered   Sent to plan

## 2021-05-14 NOTE — Telephone Encounter (Signed)
Approved today CaseId:[id number];Status:Approved;Review Type:Qty;Coverage Start Date:04/14/2021;Coverage End Date:05/14/2022;  [facility] aware

## 2021-05-24 DIAGNOSIS — L7211 Pilar cyst: Secondary | ICD-10-CM | POA: Diagnosis not present

## 2021-06-13 ENCOUNTER — Encounter: Payer: 59 | Admitting: Family Medicine

## 2021-06-13 ENCOUNTER — Encounter: Payer: Self-pay | Admitting: Family Medicine

## 2021-11-07 ENCOUNTER — Ambulatory Visit
Admission: EM | Admit: 2021-11-07 | Discharge: 2021-11-07 | Disposition: A | Discharge status: Home / Self Care | Payer: 59 | Attending: Family Medicine | Admitting: Family Medicine

## 2021-11-07 ENCOUNTER — Encounter: Payer: Self-pay | Admitting: Emergency Medicine

## 2021-11-07 ENCOUNTER — Other Ambulatory Visit: Payer: Self-pay

## 2021-11-07 DIAGNOSIS — J069 Acute upper respiratory infection, unspecified: Secondary | ICD-10-CM | POA: Diagnosis not present

## 2021-11-07 DIAGNOSIS — Z9189 Other specified personal risk factors, not elsewhere classified: Secondary | ICD-10-CM | POA: Diagnosis not present

## 2021-11-07 MED ORDER — PROMETHAZINE-DM 6.25-15 MG/5ML PO SYRP
5.0000 mL | ORAL_SOLUTION | Freq: Four times a day (QID) | ORAL | 0 refills | Status: DC | PRN
Start: 2021-11-07 — End: 2023-04-09

## 2021-11-07 MED ORDER — DEXAMETHASONE SODIUM PHOSPHATE 10 MG/ML IJ SOLN
10.0000 mg | Freq: Once | INTRAMUSCULAR | Status: AC
  Administered 2021-11-07: 10 mg via INTRAMUSCULAR

## 2021-11-07 NOTE — ED Triage Notes (Signed)
Productive Cough, sore throat at night time, and nasal congestion x 3 days.

## 2021-11-07 NOTE — ED Provider Notes (Signed)
[facility]    CSN: [id number] Arrival date & time: 11/07/21  1703      History   Chief Complaint No chief complaint on file.   HPI Chelsea Strong is a 31 y.o. female.   Presenting today with 3-day history of productive cough, sore throat, nasal congestion, chills, chest tightness, chest pain with coughing.  Denies known fever, abdominal pain, nausea vomiting or diarrhea.  No known sick contacts recently.  No known history of chronic pulmonary disease.  So far taking Tylenol with minimal relief.   Past Medical History:  Diagnosis Date   History of chlamydia    History of trichomoniasis    Vaginal Pap smear, abnormal     Patient Active Problem List   Diagnosis Date Noted   Postpartum depression 08/25/2018   Vaginal delivery 07/21/2018   Normal labor 07/19/2018   Abnormal Pap smear of cervix 02/02/2018   Supervision of normal pregnancy 01/08/2018   History of chlamydia 12/01/2012    Past Surgical History:  Procedure Laterality Date   LAPAROSCOPIC TUBAL LIGATION Bilateral 09/30/2018   Procedure: LAPAROSCOPIC BILATERAL TUBAL LIGATION USING ELECTROCAUTERY;  Surgeon: Florian Buff, MD;  Location: [facility];  Service: Gynecology;  Laterality: Bilateral;   THERAPEUTIC ABORTION  03/15/1015    OB History     Gravida  6   Para  4   Term  4   Preterm      AB  2   Living  4      SAB  1   IAB  1   Ectopic      Multiple      Live Births  4            Home Medications    Prior to Admission medications   Medication Sig Start Date End Date Taking? Authorizing Provider  promethazine-dextromethorphan (PROMETHAZINE-DM) 6.25-15 MG/5ML syrup Take 5 mLs by mouth 4 (four) times daily as needed. 11/07/21  Yes Volney American, PA-C  butalbital-acetaminophen-caffeine (FIORICET) [phone] MG tablet Take 1 tablet by mouth 2 (two) times daily as needed for headache. Patient not taking: Reported on 05/02/2021 04/16/21 04/16/22  Scot Jun, FNP   ondansetron (ZOFRAN) 4 MG tablet Take 1 tablet (4 mg total) by mouth every 8 (eight) hours as needed for nausea or vomiting. 05/02/21   Gwenlyn Perking, FNP  SUMAtriptan (IMITREX) 25 MG tablet Take 1 tablet when headache occurs. May repeat in 2 hours if headache persists or recurs. 05/02/21   Gwenlyn Perking, FNP    Family History Family History  Problem Relation Age of Onset   Cataracts Mother    Hypertension Father    Asthma Father    Cancer Paternal Aunt        breast    Social History Social History   Tobacco Use   Smoking status: Never   Smokeless tobacco: Never  Vaping Use   Vaping Use: Never used  Substance Use Topics   Alcohol use: No   Drug use: Yes    Frequency: 7.0 times per week    Types: Marijuana     Allergies   Patient has no known allergies.   Review of Systems Review of Systems Per HPI  Physical Exam Triage Vital Signs ED Triage Vitals  Enc Vitals Group     BP 11/07/21 1846 115/75     Pulse Rate 11/07/21 1846 93     Resp 11/07/21 1846 18     Temp 11/07/21 1846 98.5  F (36.9 C)     Temp Source 11/07/21 1846 Oral     SpO2 11/07/21 1846 98 %     Weight --      Height --      Head Circumference --      Peak Flow --      Pain Score 11/07/21 1848 0     Pain Loc --      Pain Edu? --      Excl. in [place]? --    No data found.  Updated Vital Signs BP 115/75 (BP Location: Right Arm)    Pulse 93    Temp 98.5 F (36.9 C) (Oral)    Resp 18    LMP 10/16/2021 (Exact Date)    SpO2 98%   Visual Acuity Right Eye Distance:   Left Eye Distance:   Bilateral Distance:    Right Eye Near:   Left Eye Near:    Bilateral Near:     Physical Exam Vitals and nursing note reviewed.  Constitutional:      Appearance: Normal appearance. She is not ill-appearing.  HENT:     Head: Atraumatic.     Right Ear: Tympanic membrane and external ear normal.     Left Ear: Tympanic membrane and external ear normal.     Nose: Rhinorrhea present.     Mouth/Throat:      Mouth: Mucous membranes are moist.     Pharynx: Posterior oropharyngeal erythema present.  Eyes:     Extraocular Movements: Extraocular movements intact.     Conjunctiva/sclera: Conjunctivae normal.  Cardiovascular:     Rate and Rhythm: Normal rate and regular rhythm.     Heart sounds: Normal heart sounds.  Pulmonary:     Effort: Pulmonary effort is normal.     Breath sounds: Normal breath sounds. No wheezing or rales.  Musculoskeletal:        General: Normal range of motion.     Cervical back: Normal range of motion and neck supple.  Skin:    General: Skin is warm and dry.  Neurological:     Mental Status: She is alert and oriented to person, place, and time.  Psychiatric:        Mood and Affect: Mood normal.        Thought Content: Thought content normal.        Judgment: Judgment normal.     UC Treatments / Results  Labs (all labs ordered are listed, but only abnormal results are displayed) Labs Reviewed  COVID-19, FLU A+B NAA    EKG   Radiology No results found.  Procedures Procedures (including critical care time)  Medications Ordered in UC Medications  dexamethasone (DECADRON) injection 10 mg (10 mg Intramuscular Given 11/07/21 1912)    Initial Impression / Assessment and Plan / UC Course  I have reviewed the triage vital signs and the nursing notes.  Pertinent labs & imaging results that were available during my care of the patient were reviewed by me and considered in my medical decision making (see chart for details).     Vitals benign and reassuring, suspect viral upper respiratory infection causing symptoms.  Will give IM Decadron given her chest tightness and pain with coughing, Phenergan DM, supportive over-the-counter medications and home care reviewed.  COVID and flu testing pending.  Work note given.  Return for acutely worsening symptoms.  Final Clinical Impressions(s) / UC Diagnoses   Final diagnoses:  At increased risk of exposure to  COVID-19 virus  Viral URI with cough   Discharge Instructions   None    ED Prescriptions     Medication Sig Dispense Auth. Provider   promethazine-dextromethorphan (PROMETHAZINE-DM) 6.25-15 MG/5ML syrup Take 5 mLs by mouth 4 (four) times daily as needed. 100 mL Volney American, [place]      PDMP not reviewed this encounter.   Volney American, [place] 11/07/21 1923

## 2021-11-08 LAB — COVID-19, FLU A+B NAA
Influenza A, NAA: NOT DETECTED
Influenza B, NAA: NOT DETECTED
SARS-CoV-2, NAA: NOT DETECTED

## 2022-12-02 ENCOUNTER — Encounter: Payer: Medicaid Other | Admitting: Family Medicine

## 2023-02-27 ENCOUNTER — Encounter: Payer: Medicaid Other | Admitting: Family Medicine

## 2023-02-28 ENCOUNTER — Encounter: Payer: Self-pay | Admitting: Family Medicine

## 2023-03-21 IMAGING — DX DG CHEST 2V
2 series · 2 of 2 positions shown · non-contrast
Comparison: 12/06/2015

CLINICAL DATA: Weakness, chest pain

EXAM:
CHEST - 2 VIEW

[chest pa]
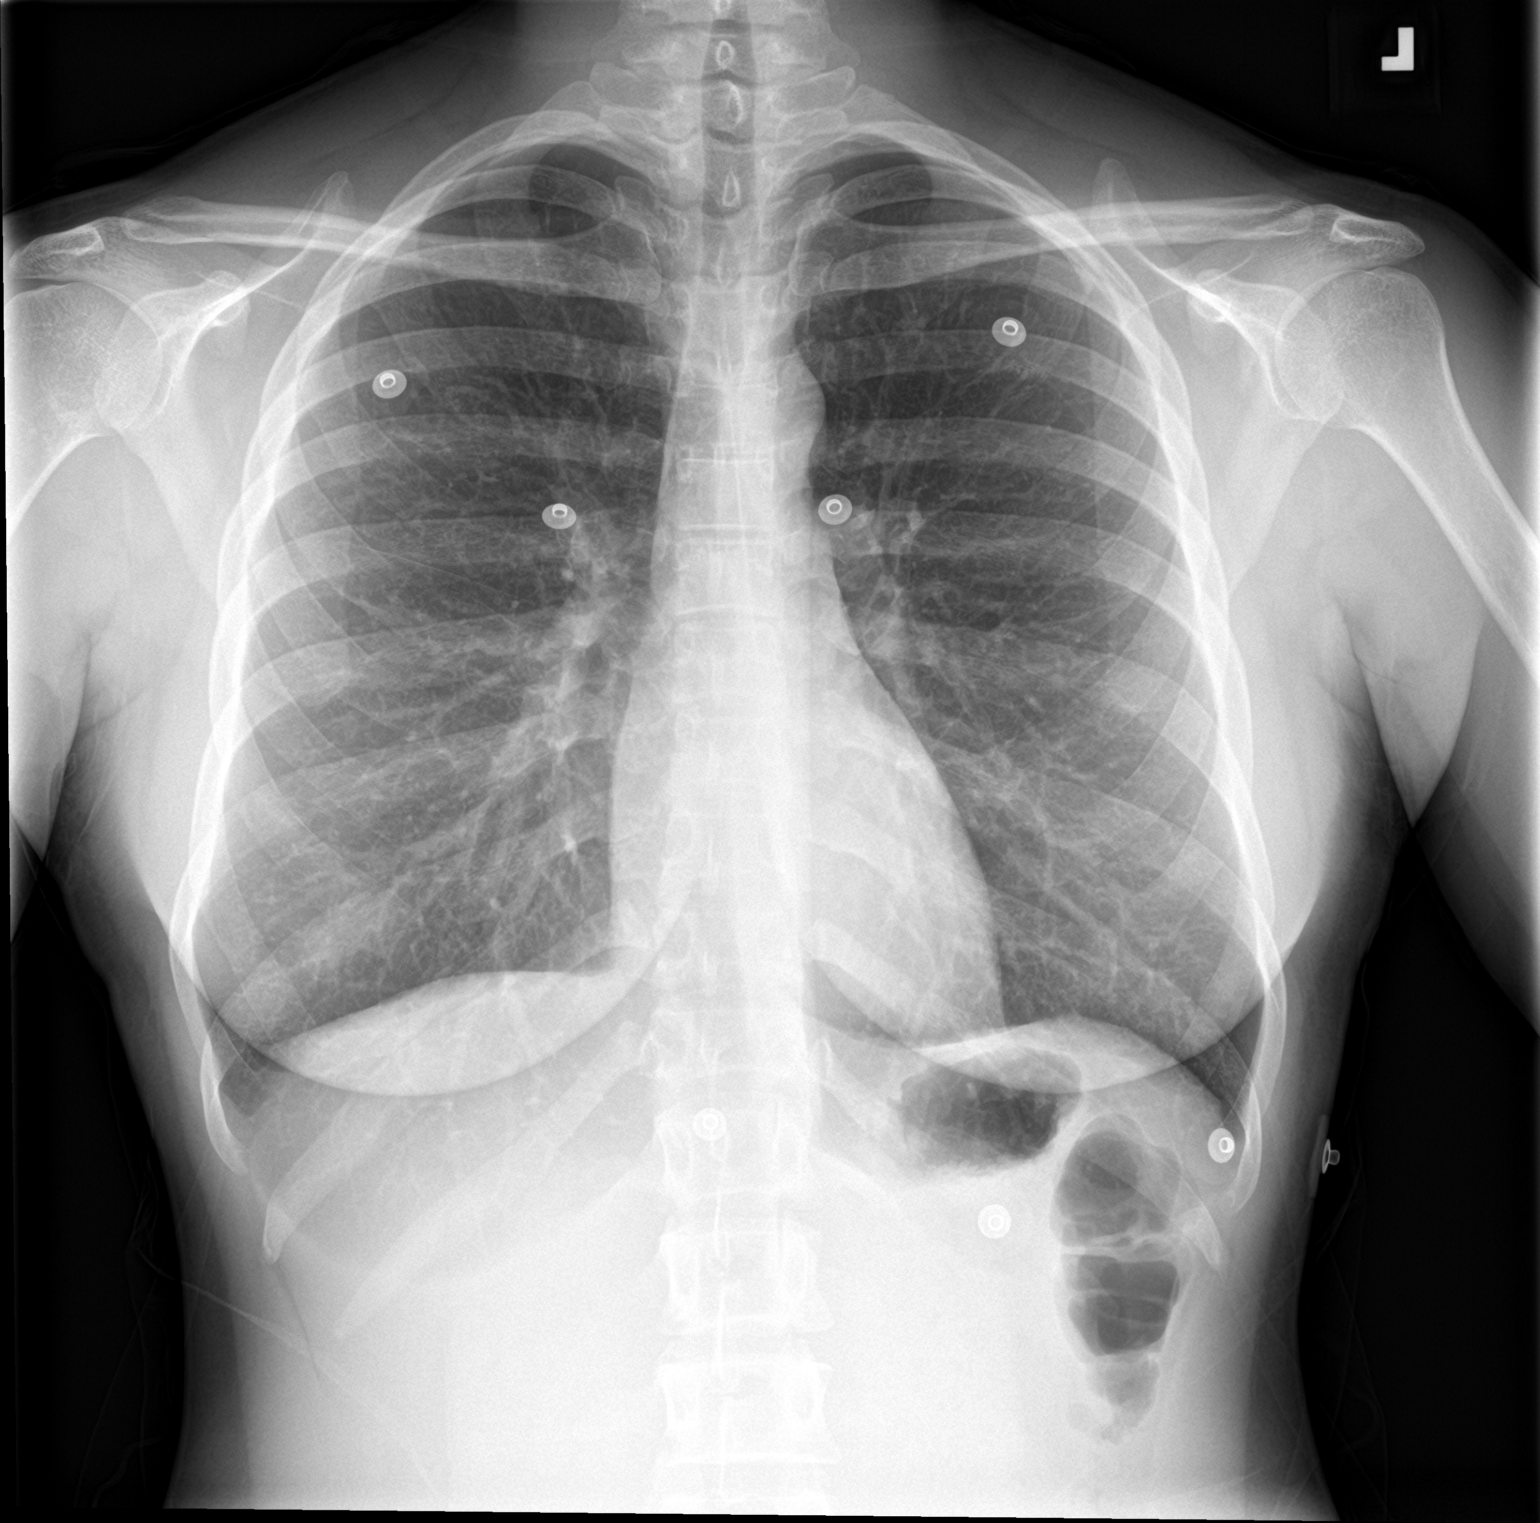

[chest lat]
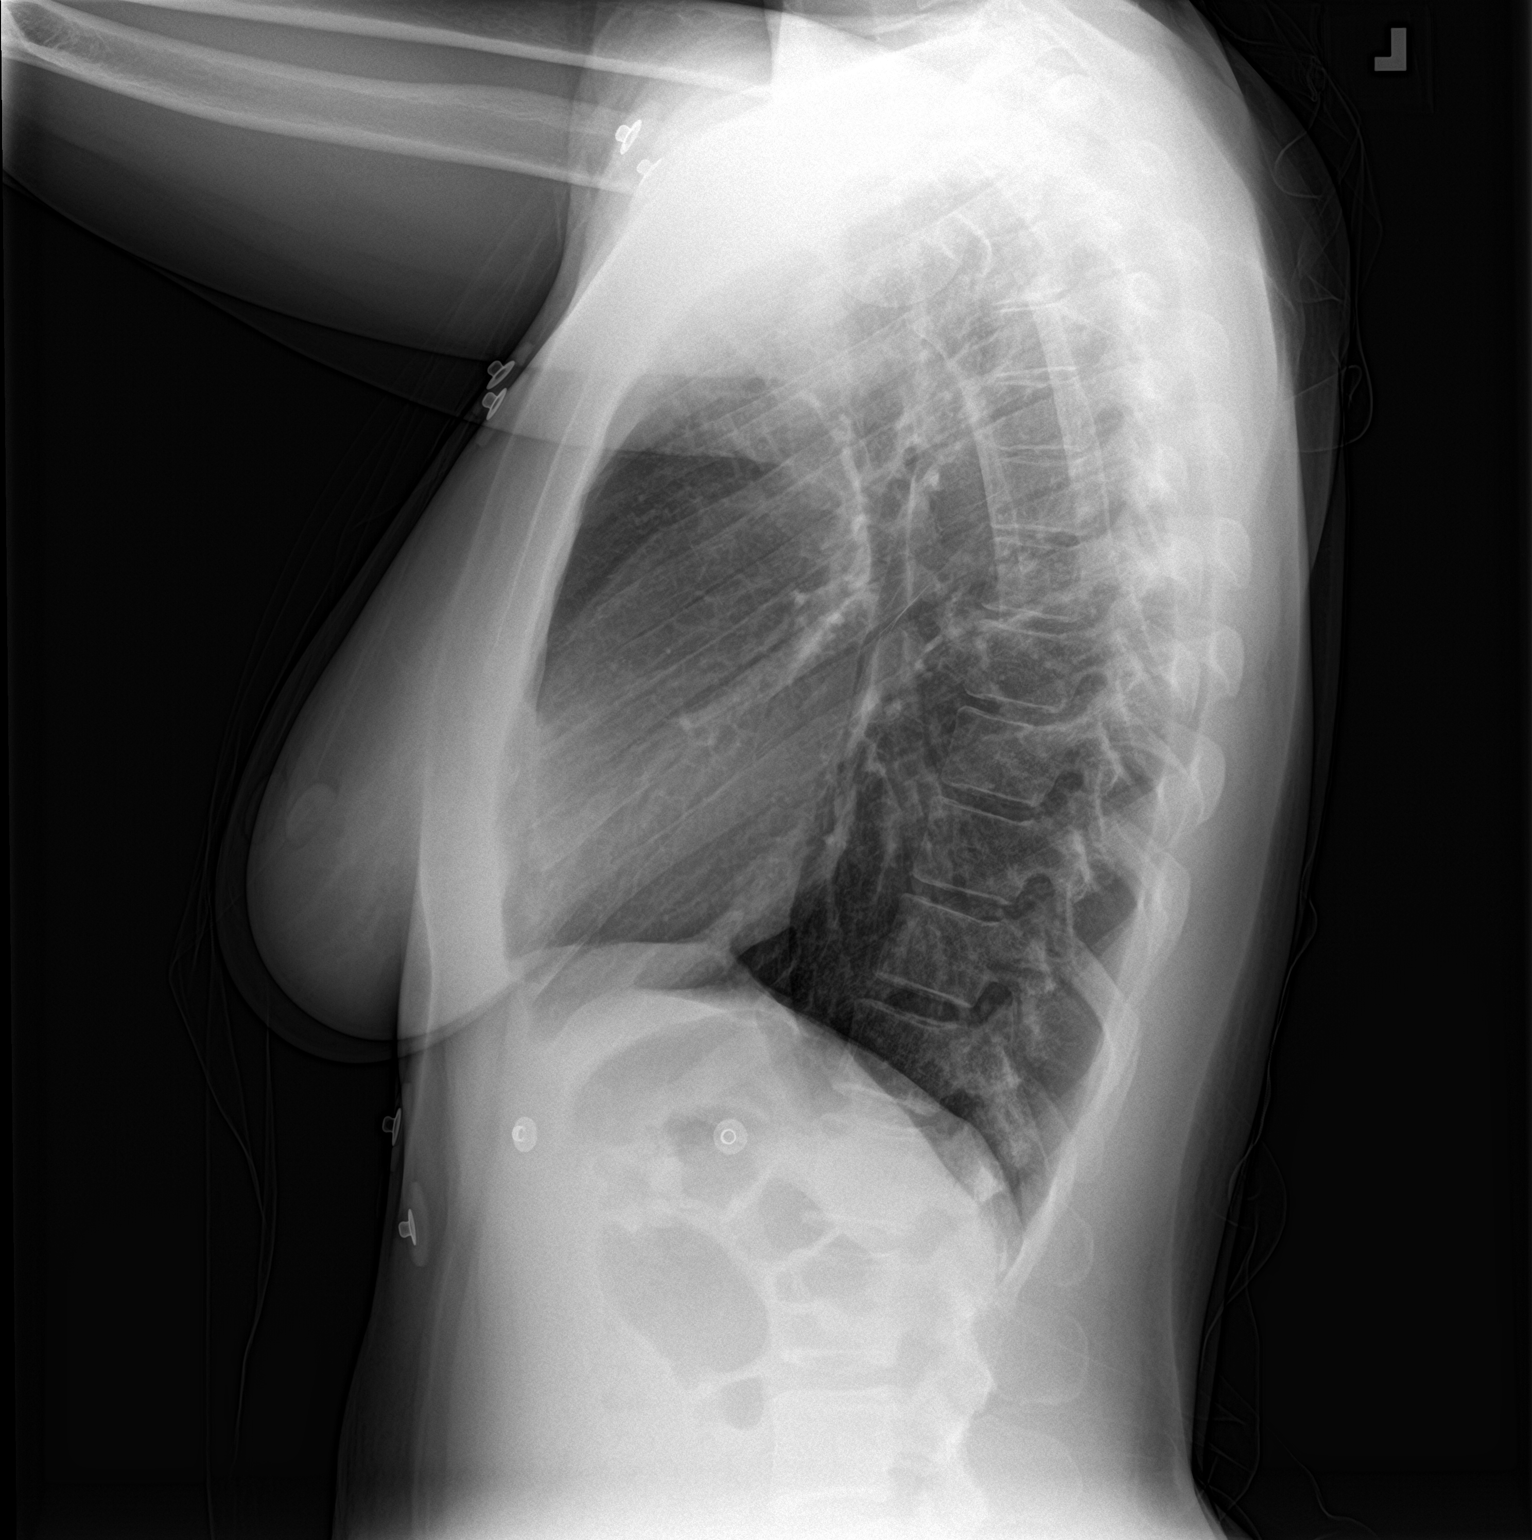

[2 of 2 positions shown; findings below may reference images not displayed]

FINDINGS: The heart size and mediastinal contours are within normal limits.
Both lungs are clear. The visualized skeletal structures are
unremarkable.
IMPRESSION: Normal study.

## 2023-04-09 ENCOUNTER — Encounter: Payer: Self-pay | Admitting: Family Medicine

## 2023-04-09 ENCOUNTER — Ambulatory Visit (INDEPENDENT_AMBULATORY_CARE_PROVIDER_SITE_OTHER): Payer: Medicaid Other | Admitting: Family Medicine

## 2023-04-09 VITALS — BP 104/72 | HR 56 | Temp 97.4°F | Resp 20 | Ht 61.0 in | Wt 163.6 lb

## 2023-04-09 DIAGNOSIS — Z0001 Encounter for general adult medical examination with abnormal findings: Secondary | ICD-10-CM

## 2023-04-09 DIAGNOSIS — F419 Anxiety disorder, unspecified: Secondary | ICD-10-CM

## 2023-04-09 DIAGNOSIS — Z8742 Personal history of other diseases of the female genital tract: Secondary | ICD-10-CM | POA: Diagnosis not present

## 2023-04-09 DIAGNOSIS — Z6830 Body mass index (BMI) 30.0-30.9, adult: Secondary | ICD-10-CM

## 2023-04-09 DIAGNOSIS — Z124 Encounter for screening for malignant neoplasm of cervix: Secondary | ICD-10-CM | POA: Diagnosis not present

## 2023-04-09 DIAGNOSIS — E66811 Obesity, class 1: Secondary | ICD-10-CM | POA: Insufficient documentation

## 2023-04-09 DIAGNOSIS — G43009 Migraine without aura, not intractable, without status migrainosus: Secondary | ICD-10-CM

## 2023-04-09 DIAGNOSIS — Z0000 Encounter for general adult medical examination without abnormal findings: Secondary | ICD-10-CM

## 2023-04-09 DIAGNOSIS — F339 Major depressive disorder, recurrent, unspecified: Secondary | ICD-10-CM

## 2023-04-09 DIAGNOSIS — E6609 Other obesity due to excess calories: Secondary | ICD-10-CM

## 2023-04-09 HISTORY — DX: Personal history of other diseases of the female genital tract: Z87.42

## 2023-04-09 LAB — BAYER DCA HB A1C WAIVED: HB A1C (BAYER DCA - WAIVED): 4.7 % — ABNORMAL LOW (ref 4.8–5.6)

## 2023-04-09 MED ORDER — CITALOPRAM HYDROBROMIDE 10 MG PO TABS: 10.0000 mg | ORAL_TABLET | Freq: Every day | ORAL | 3 refills | Status: AC

## 2023-04-09 MED ORDER — SUMATRIPTAN SUCCINATE 50 MG PO TABS: ORAL_TABLET | ORAL | 11 refills | Status: AC

## 2023-04-09 MED ORDER — ONDANSETRON HCL 4 MG PO TABS: 4.0000 mg | ORAL_TABLET | Freq: Three times a day (TID) | ORAL | 1 refills | Status: AC | PRN

## 2023-04-09 NOTE — Patient Instructions (Signed)

## 2023-04-09 NOTE — Progress Notes (Signed)
Complete physical exam  Patient: Chelsea Strong   DOB: 11-17-1990   32 y.o. Female  MRN: [id number]  Subjective:    Chief Complaint  Patient presents with   Annual Exam    Chelsea Strong is a 32 y.o. female who presents today for a complete physical exam. She reports consuming a general diet. The patient does not participate in regular exercise at present. She generally feels fairly well. She reports sleeping fairly well. She does have additional problems to discuss today.   Getting a migraine 2-3x a month. Last 3-4 days at a time with nausea, photosensitivity and sensitive to noise. Imitrex dulls the headache to manageable but she has been out.   Hx of anxiety and depression. She has tried lexapro in the past but felt apathetic so she stopped taking it. She is interested in trying medication again and counseling.    Most recent fall risk assessment:    05/02/2021    9:37 AM  Fall Risk   Falls in the past year? 0     Most recent depression screenings:    04/09/2023   10:23 AM 05/02/2021    9:37 AM 01/08/2018   11:03 AM  Depression screen PHQ 2/9  Decreased Interest 2 2 1   Down, Depressed, Hopeless 2 1 0  PHQ - 2 Score 4 3 1   Altered sleeping 1 2 1   Tired, decreased energy 1 1 1   Change in appetite 2 1 1   Feeling bad or failure about yourself  2 1 0  Trouble concentrating 3 0 0  Moving slowly or fidgety/restless 2 1 0  Suicidal thoughts 0 0 0  PHQ-9 Score 15 9 4   Difficult doing work/chores Somewhat difficult Somewhat difficult       04/09/2023   10:23 AM 05/02/2021    9:37 AM  GAD 7 : Generalized Anxiety Score  Nervous, Anxious, on Edge 2 1  Control/stop worrying 2 1  Worry too much - different things 2 1  Trouble relaxing 2 1  Restless 1 0  Easily annoyed or irritable 3 2  Afraid - awful might happen 1 0  Total GAD 7 Score 13 6  Anxiety Difficulty Somewhat difficult Somewhat difficult      Vision:Not within last year  and Dental: No current dental  problems and Receives regular dental care  Past Medical History:  Diagnosis Date   History of chlamydia    History of trichomoniasis    Migraine    Vaginal Pap smear, abnormal       Patient Care Team: Gabriel Earing, FNP as PCP - General (Family Medicine)   Outpatient Medications Prior to Visit  Medication Sig   [DISCONTINUED] ondansetron (ZOFRAN) 4 MG tablet Take 1 tablet (4 mg total) by mouth every 8 (eight) hours as needed for nausea or vomiting.   [DISCONTINUED] promethazine-dextromethorphan (PROMETHAZINE-DM) 6.25-15 MG/5ML syrup Take 5 mLs by mouth 4 (four) times daily as needed.   [DISCONTINUED] SUMAtriptan (IMITREX) 25 MG tablet Take 1 tablet when headache occurs. May repeat in 2 hours if headache persists or recurs.   No facility-administered medications prior to visit.    ROS Negative unless specially indicated above in HPI.      Objective:     BP 104/72   Pulse (!) 56   Temp (!) 97.4 F (36.3 C) (Oral)   Resp 20   Ht 5\' 1"  (1.549 m)   Wt 163 lb 9 oz (74.2 kg)   SpO2 100%  BMI 30.90 kg/m    Physical Exam Vitals and nursing note reviewed.  Constitutional:      General: She is not in acute distress.    Appearance: She is obese. She is not ill-appearing, toxic-appearing or diaphoretic.  HENT:     Head: Normocephalic.     Right Ear: Tympanic membrane, ear canal and external ear normal.     Left Ear: Tympanic membrane, ear canal and external ear normal.     Nose: Nose normal.     Mouth/Throat:     Mouth: Mucous membranes are moist.     Pharynx: Oropharynx is clear.  Eyes:     Extraocular Movements: Extraocular movements intact.     Conjunctiva/sclera: Conjunctivae normal.     Pupils: Pupils are equal, round, and reactive to light.  Neck:     Thyroid: No thyroid mass, thyromegaly or thyroid tenderness.  Cardiovascular:     Rate and Rhythm: Normal rate and regular rhythm.     Pulses: Normal pulses.     Heart sounds: Normal heart sounds. No murmur  heard.    No friction rub. No gallop.  Pulmonary:     Effort: Pulmonary effort is normal.     Breath sounds: Normal breath sounds.  Abdominal:     General: Bowel sounds are normal. There is no distension.     Palpations: Abdomen is soft. There is no mass.     Tenderness: There is no abdominal tenderness. There is no guarding.  Musculoskeletal:        General: No swelling or tenderness. Normal range of motion.     Cervical back: Normal range of motion and neck supple. No tenderness.     Right lower leg: No edema.     Left lower leg: No edema.  Skin:    General: Skin is warm and dry.     Capillary Refill: Capillary refill takes less than 2 seconds.     Findings: No lesion or rash.  Neurological:     General: No focal deficit present.     Mental Status: She is alert and oriented to person, place, and time.     Cranial Nerves: No cranial nerve deficit.     Motor: No weakness.     Gait: Gait normal.  Psychiatric:        Mood and Affect: Mood normal.        Behavior: Behavior normal.        Thought Content: Thought content normal.        Judgment: Judgment normal.      No results found for any visits on 04/09/23.     Assessment & Plan:    Routine Health Maintenance and Physical Exam  Chelsea Strong was seen today for annual exam.  Diagnoses and all orders for this visit:  Routine general medical examination at a health care facility  Cervical cancer screening History of abnormal cervical Pap smear Referral to GYN. -     Ambulatory referral to Gynecology  Class 1 obesity due to excess calories without serious comorbidity with body mass index (BMI) of 30.0 to 30.9 in adult Diet and exercise. Fasting labs pending.  -     CBC with Differential/Platelet -     CMP14+EGFR -     Lipid panel -     TSH -     VITAMIN D 25 Hydroxy (Vit-D Deficiency, Fractures) -     Bayer DCA Hb A1c Waived  Depression, recurrent (HCC) Anxiety Uncontrolled. Denies SI. Referral to  [facility]  discussed and placed. Start celexa as below. Will follo up in 6 weeks, sooner for new or worsening symptoms. Patient and/or legal guardian verbally consented to [facility] services about presenting concerns and psychiatric consultation as appropriate.  The services will be billed as appropriate for the patient -     citalopram (CELEXA) 10 MG tablet; Take 1 tablet (10 mg total) by mouth daily. -     Ambulatory referral to Integrated Behavioral Health  Migraine without aura and without status migrainosus, not intractable Will increase imitrex to 50 mg prn. Will follow up at next visit.  -     SUMAtriptan (IMITREX) 50 MG tablet; Take 1 tablet when headache occurs. May repeat in 2 hours if headache persists or recurs. -     ondansetron (ZOFRAN) 4 MG tablet; Take 1 tablet (4 mg total) by mouth every 8 (eight) hours as needed for nausea or vomiting.    Immunization History  Administered Date(s) Administered   Tdap 05/14/2018    Health Maintenance  Topic Date Due   Hepatitis C Screening  Never done   PAP SMEAR-Modifier  01/08/2021   COVID-19 Vaccine (1 - 2023-24 season) Never done   INFLUENZA VACCINE  04/24/2023   DTaP/Tdap/Td (2 - Td or Tdap) 05/14/2028   HIV Screening  Completed   HPV VACCINES  Aged Out    Discussed health benefits of physical activity, and encouraged her to engage in regular exercise appropriate for her age and condition.  Problem List Items Addressed This Visit       Cardiovascular and Mediastinum   Migraine without aura and without status migrainosus, not intractable   Relevant Medications   SUMAtriptan (IMITREX) 50 MG tablet   ondansetron (ZOFRAN) 4 MG tablet   citalopram (CELEXA) 10 MG tablet     Other   History of abnormal cervical Pap smear   Relevant Orders   Ambulatory referral to Gynecology   Class 1 obesity due to excess calories without serious comorbidity with body mass index (BMI) of 30.0 to 30.9 in adult   Relevant  Orders   CBC with Differential/Platelet   CMP14+EGFR   Lipid panel   TSH   VITAMIN D 25 Hydroxy (Vit-D Deficiency, Fractures)   Bayer DCA Hb A1c Waived   Depression, recurrent (HCC)   Relevant Medications   citalopram (CELEXA) 10 MG tablet   Other Relevant Orders   Ambulatory referral to Integrated Behavioral Health   Anxiety   Relevant Medications   citalopram (CELEXA) 10 MG tablet   Other Relevant Orders   Ambulatory referral to Integrated Behavioral Health   Other Visit Diagnoses     Routine general medical examination at a health care facility    -  Primary   Cervical cancer screening       Relevant Orders   Ambulatory referral to Gynecology      Return in about 6 weeks (around 05/21/2023) for medication follow up.   The patient indicates understanding of these issues and agrees with the plan.  Gabriel Earing, FNP

## 2023-04-10 ENCOUNTER — Other Ambulatory Visit: Payer: Self-pay | Admitting: Family Medicine

## 2023-04-10 DIAGNOSIS — E559 Vitamin D deficiency, unspecified: Secondary | ICD-10-CM

## 2023-04-10 LAB — CBC WITH DIFFERENTIAL/PLATELET
Basophils Absolute: 0 10*3/uL (ref 0.0–0.2)
Basos: 1 %
EOS (ABSOLUTE): 0.1 10*3/uL (ref 0.0–0.4)
Eos: 1 %
Hematocrit: 33.4 % — ABNORMAL LOW (ref 34.0–46.6)
Hemoglobin: 11.2 g/dL (ref 11.1–15.9)
Immature Grans (Abs): 0 10*3/uL (ref 0.0–0.1)
Immature Granulocytes: 0 %
Lymphocytes Absolute: 2.5 10*3/uL (ref 0.7–3.1)
Lymphs: 45 %
MCH: 28.2 pg (ref 26.6–33.0)
MCHC: 33.5 g/dL (ref 31.5–35.7)
MCV: 84 fL (ref 79–97)
Monocytes Absolute: 0.7 10*3/uL (ref 0.1–0.9)
Monocytes: 12 %
Neutrophils Absolute: 2.2 10*3/uL (ref 1.4–7.0)
Neutrophils: 41 %
Platelets: 307 10*3/uL (ref 150–450)
RBC: 3.97 x10E6/uL (ref 3.77–5.28)
RDW: 11 % — ABNORMAL LOW (ref 11.7–15.4)
WBC: 5.5 10*3/uL (ref 3.4–10.8)

## 2023-04-10 LAB — LIPID PANEL
Chol/HDL Ratio: 3 ratio (ref 0.0–4.4)
Cholesterol, Total: 139 mg/dL (ref 100–199)
HDL: 46 mg/dL (ref >39)
LDL Chol Calc (NIH): 80 mg/dL (ref 0–99)
Triglycerides: 61 mg/dL (ref 0–149)
VLDL Cholesterol Cal: 13 mg/dL (ref 5–40)

## 2023-04-10 LAB — CMP14+EGFR
ALT: 15 IU/L (ref 0–32)
AST: 17 IU/L (ref 0–40)
Albumin: 3.9 g/dL (ref 3.9–4.9)
Alkaline Phosphatase: 66 IU/L (ref 44–121)
BUN/Creatinine Ratio: 12 (ref 9–23)
BUN: 12 mg/dL (ref 6–20)
Bilirubin Total: 0.7 mg/dL (ref 0.0–1.2)
CO2: 22 mmol/L (ref 20–29)
Calcium: 9.2 mg/dL (ref 8.7–10.2)
Chloride: 103 mmol/L (ref 96–106)
Creatinine, Ser: 1.04 mg/dL — ABNORMAL HIGH (ref 0.57–1.00)
Globulin, Total: 3 g/dL (ref 1.5–4.5)
Glucose: 88 mg/dL (ref 70–99)
Potassium: 4.6 mmol/L (ref 3.5–5.2)
Sodium: 137 mmol/L (ref 134–144)
Total Protein: 6.9 g/dL (ref 6.0–8.5)
eGFR: 74 mL/min/{1.73_m2} (ref >59)

## 2023-04-10 LAB — VITAMIN D 25 HYDROXY (VIT D DEFICIENCY, FRACTURES): Vit D, 25-Hydroxy: 16.4 ng/mL — ABNORMAL LOW (ref 30.0–100.0)

## 2023-04-10 LAB — TSH: TSH: 1.1 u[IU]/mL (ref 0.450–4.500)

## 2023-04-10 MED ORDER — VITAMIN D (ERGOCALCIFEROL) 1.25 MG (50000 UNIT) PO CAPS: 50000.0000 [IU] | ORAL_CAPSULE | ORAL | 0 refills | Status: AC

## 2023-04-21 ENCOUNTER — Other Ambulatory Visit (HOSPITAL_COMMUNITY)
Admission: RE | Admit: 2023-04-21 | Payer: Medicaid Other | Source: Ambulatory Visit | Admitting: Obstetrics & Gynecology

## 2023-04-21 ENCOUNTER — Encounter: Payer: Self-pay | Admitting: Obstetrics & Gynecology

## 2023-04-21 ENCOUNTER — Ambulatory Visit (INDEPENDENT_AMBULATORY_CARE_PROVIDER_SITE_OTHER): Payer: Medicaid Other | Admitting: Obstetrics & Gynecology

## 2023-04-21 VITALS — BP 119/79 | HR 56 | Wt 163.5 lb

## 2023-04-21 DIAGNOSIS — Z01419 Encounter for gynecological examination (general) (routine) without abnormal findings: Secondary | ICD-10-CM | POA: Diagnosis not present

## 2023-04-21 DIAGNOSIS — Z124 Encounter for screening for malignant neoplasm of cervix: Secondary | ICD-10-CM | POA: Diagnosis not present

## 2023-04-21 DIAGNOSIS — N920 Excessive and frequent menstruation with regular cycle: Secondary | ICD-10-CM | POA: Diagnosis not present

## 2023-04-21 DIAGNOSIS — N946 Dysmenorrhea, unspecified: Secondary | ICD-10-CM

## 2023-04-21 MED ORDER — DESOGESTREL-ETHINYL ESTRADIOL 0.15-0.02/0.01 MG (21/5) PO TABS: 1.0000 | ORAL_TABLET | Freq: Every day | ORAL | 12 refills | Status: AC

## 2023-04-21 MED ORDER — IBUPROFEN 800 MG PO TABS: 800.0000 mg | ORAL_TABLET | Freq: Three times a day (TID) | ORAL | 5 refills | Status: AC | PRN

## 2023-04-21 NOTE — Progress Notes (Signed)
Chief Complaint  Patient presents with   Gynecologic Exam    Pap only      32 y.o. [id number] No LMP recorded. The current method of family planning is tubal ligation.  Outpatient Encounter Medications as of 04/21/2023  Medication Sig   citalopram (CELEXA) 10 MG tablet Take 1 tablet (10 mg total) by mouth daily.   desogestrel-ethinyl estradiol (MIRCETTE) 0.15-0.02/0.01 MG (21/5) tablet Take 1 tablet by mouth daily.   ibuprofen (ADVIL) 800 MG tablet Take 1 tablet (800 mg total) by mouth every 8 (eight) hours as needed.   ondansetron (ZOFRAN) 4 MG tablet Take 1 tablet (4 mg total) by mouth every 8 (eight) hours as needed for nausea or vomiting.   SUMAtriptan (IMITREX) 50 MG tablet Take 1 tablet when headache occurs. May repeat in 2 hours if headache persists or recurs.   Vitamin D, Ergocalciferol, (DRISDOL) 1.25 MG (50000 UNIT) CAPS capsule Take 1 capsule (50,000 Units total) by mouth every 7 (seven) days.   No facility-administered encounter medications on file as of 04/21/2023.    Subjective Pt with worsening menstrual cycles Regular but very heavy and very crampy/painful  Past Medical History:  Diagnosis Date   History of abnormal cervical Pap smear 04/09/2023   History of chlamydia    History of trichomoniasis    Migraine    Vaginal Pap smear, abnormal     Past Surgical History:  Procedure Laterality Date   LAPAROSCOPIC TUBAL LIGATION Bilateral 09/30/2018   Procedure: LAPAROSCOPIC BILATERAL TUBAL LIGATION USING ELECTROCAUTERY;  Surgeon: Lazaro Arms, MD;  Location: [facility];  Service: Gynecology;  Laterality: Bilateral;   THERAPEUTIC ABORTION  03/15/1015    OB History     Gravida  6   Para  4   Term  4   Preterm      AB  2   Living  4      SAB  1   IAB  1   Ectopic      Multiple      Live Births  4           No Known Allergies  Social History   Socioeconomic History   Marital status: Single    Spouse name: Not on file   Number of  children: 4   Years of education: 12   Highest education level: High school graduate  Occupational History   Not on file  Tobacco Use   Smoking status: Never   Smokeless tobacco: Never  Vaping Use   Vaping status: Never Used  Substance and Sexual Activity   Alcohol use: No   Drug use: Yes    Frequency: 7.0 times per week    Types: Marijuana   Sexual activity: Yes    Birth control/protection: Surgical    Comment: tubal  Other Topics Concern   Not on file  Social History Narrative   Not on file   Social Determinants of Health   Financial Resource Strain: Low Risk  (04/21/2023)   Overall Financial Resource Strain (CARDIA)    Difficulty of Paying Living Expenses: Not very hard  Food Insecurity: Food Insecurity Present (04/21/2023)   Hunger Vital Sign    Worried About Running Out of Food in the Last Year: Never true    Ran Out of Food in the Last Year: Sometimes true  Transportation Needs: No Transportation Needs (04/21/2023)   PRAPARE - Transportation    Lack of Transportation (Medical): No    Lack of  Transportation (Non-Medical): No  Physical Activity: Insufficiently Active (04/21/2023)   Exercise Vital Sign    Days of Exercise per Week: 1 day    Minutes of Exercise per Session: 10 min  Stress: Stress Concern Present (04/21/2023)   [employer] of Occupational Health - Occupational Stress Questionnaire    Feeling of Stress : To some extent  Social Connections: Socially Isolated (04/21/2023)   Social Connection and Isolation Panel [NHANES]    Frequency of Communication with Friends and Family: Twice a week    Frequency of Social Gatherings with Friends and Family: Never    Attends Religious Services: Never    Database administrator or Organizations: No    Attends Engineer, structural: Never    Marital Status: Never married    Family History  Problem Relation Age of Onset   Hypertension Father    Asthma Father    Cataracts Mother    Crohn's disease Son     Cancer Paternal Aunt        breast    Medications:       Current Outpatient Medications:    citalopram (CELEXA) 10 MG tablet, Take 1 tablet (10 mg total) by mouth daily., Disp: 90 tablet, Rfl: 3   desogestrel-ethinyl estradiol (MIRCETTE) 0.15-0.02/0.01 MG (21/5) tablet, Take 1 tablet by mouth daily., Disp: 28 tablet, Rfl: 12   ibuprofen (ADVIL) 800 MG tablet, Take 1 tablet (800 mg total) by mouth every 8 (eight) hours as needed., Disp: 30 tablet, Rfl: 5   ondansetron (ZOFRAN) 4 MG tablet, Take 1 tablet (4 mg total) by mouth every 8 (eight) hours as needed for nausea or vomiting., Disp: 20 tablet, Rfl: 1   SUMAtriptan (IMITREX) 50 MG tablet, Take 1 tablet when headache occurs. May repeat in 2 hours if headache persists or recurs., Disp: 10 tablet, Rfl: 11   Vitamin D, Ergocalciferol, (DRISDOL) 1.25 MG (50000 UNIT) CAPS capsule, Take 1 capsule (50,000 Units total) by mouth every 7 (seven) days., Disp: 8 capsule, Rfl: 0  Objective Blood pressure 119/79, pulse (!) 56, weight 163 lb 8 oz (74.2 kg).  General WDWN female NAD Vulva:  normal appearing vulva with no masses, tenderness or lesions Vagina:  normal mucosa, no discharge Cervix:  Normal no lesions Uterus:  normal size, contour, position, consistency, mobility, non-tender Adnexa: ovaries:present,  normal adnexa in size, nontender and no masses   Pertinent ROS No burning with urination, frequency or urgency No nausea, vomiting or diarrhea Nor fever chills or other constitutional symptoms   Labs or studies     Impression + Management Plan: Diagnoses this Encounter::   ICD-10-CM   1. Encntr for gyn exam (general) (routine) w/o abn findings  Z01.419     2. Routine cervical smear  Z12.4 Cytology - PAP( )    3. Menorrhagia with regular cycle  N92.0    COC + NSAID    4. Dysmenorrhea  N94.6    COC + NSAID        Medications prescribed during  this encounter: Meds ordered this encounter  Medications    desogestrel-ethinyl estradiol (MIRCETTE) 0.15-0.02/0.01 MG (21/5) tablet    Sig: Take 1 tablet by mouth daily.    Dispense:  28 tablet    Refill:  12   ibuprofen (ADVIL) 800 MG tablet    Sig: Take 1 tablet (800 mg total) by mouth every 8 (eight) hours as needed.    Dispense:  30 tablet    Refill:  5  Labs or Scans Ordered during this encounter: No orders of the defined types were placed in this encounter.     Follow up Return in about 3 years (around 04/20/2026) for yearly.

## 2023-05-05 ENCOUNTER — Institutional Professional Consult (permissible substitution): Payer: Medicaid Other | Admitting: Professional Counselor

## 2023-05-08 ENCOUNTER — Encounter: Payer: Self-pay | Admitting: Family Medicine

## 2023-05-12 ENCOUNTER — Ambulatory Visit (INDEPENDENT_AMBULATORY_CARE_PROVIDER_SITE_OTHER): Payer: Medicaid Other | Admitting: Professional Counselor

## 2023-05-12 DIAGNOSIS — F331 Major depressive disorder, recurrent, moderate: Secondary | ICD-10-CM

## 2023-05-12 NOTE — Patient Instructions (Signed)
If your symptoms worsen or you have thoughts of suicide/homicide, PLEASE SEEK IMMEDIATE MEDICAL ATTENTION.  You may always call:   National Suicide Hotline: 988 or 800-273-8255 [place] Crisis Line: 336-832-9700 Crisis Recovery in [place]: 800-939-5911      These are available 24 hours a day, 7 days a week.  
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
## 2023-05-23 ENCOUNTER — Ambulatory Visit: Payer: Medicaid Other | Admitting: Family Medicine

## 2023-05-27 ENCOUNTER — Ambulatory Visit: Payer: Medicaid Other | Admitting: Professional Counselor

## 2023-05-27 ENCOUNTER — Encounter: Payer: Self-pay | Admitting: Family Medicine

## 2023-05-28 NOTE — Progress Notes (Signed)
Collaborative Care Treatment Plan  I reviewed the information and treatment recommendations from the [facility] Counselor and Psychiatric Consultant.  I do agree with the Collaborative Care Team's treatment plan, that includes the Consulting Psychiatrists' recommendations for medication management.  I will follow up with the patient's treatment as appropriate.  Time spent collaborating with team: 15 minutes

## 2023-06-05 ENCOUNTER — Telehealth (INDEPENDENT_AMBULATORY_CARE_PROVIDER_SITE_OTHER): Payer: Medicaid Other | Admitting: Professional Counselor

## 2023-06-05 DIAGNOSIS — F330 Major depressive disorder, recurrent, mild: Secondary | ICD-10-CM
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
## 2023-06-10 ENCOUNTER — Emergency Department (HOSPITAL_COMMUNITY)
Admission: EM | Admit: 2023-06-10 | Discharge: 2023-06-11 | Discharge status: Against Medical Advice | Payer: Medicaid Other | Attending: Emergency Medicine | Admitting: Emergency Medicine

## 2023-06-10 ENCOUNTER — Other Ambulatory Visit: Payer: Self-pay

## 2023-06-10 DIAGNOSIS — R6884 Jaw pain: Secondary | ICD-10-CM | POA: Diagnosis present

## 2023-06-10 DIAGNOSIS — Z5321 Procedure and treatment not carried out due to patient leaving prior to being seen by health care provider: Secondary | ICD-10-CM | POA: Diagnosis not present
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
## 2023-06-11 NOTE — ED Notes (Signed)
Pt called multiple times no answer 

## 2023-08-04 ENCOUNTER — Ambulatory Visit: Payer: Medicaid Other | Admitting: Professional Counselor

## 2023-08-04 DIAGNOSIS — F331 Major depressive disorder, recurrent, moderate: Secondary | ICD-10-CM

## 2023-08-04 NOTE — BH Specialist Note (Unsigned)
[facility] Virtual BH Telephone Follow-up  MRN: [id number] NAME: Chelsea Strong Date: 08/05/23  Start time: Start Time: 0200 End time: Stop Time: 0230 Total time: Total Time in Minutes (Visit): 30 Call number: Visit Number: 3- Third Visit  Reason for call today:  The patient is a 32 year old female who presented for a collaborative care follow-up, her first in over two months. She reports struggling with depressive episodes and persistent anxiety that have recently impacted her motivation to engage in therapy. Despite these challenges, she expressed a desire to re-engage in collaborative care and work on herself. She noted that her symptoms have affected her daily functioning, such as requiring her to miss several days of work due to difficulty getting out of bed. She also reported impacts on her relationship with her children, sharing that while she sometimes lacks the energy to play or engage as she wishes, she still manages to fulfill their needs, often sacrificing her own well-being for them.   The patient also acknowledged difficulty adhering to her medication regimen; she initially experienced benefits but gradually missed doses and eventually stopped. She plans to restart her medication and focus on maintaining a positive outlook. The patient reported experiencing passive suicidal thoughts in the past month, describing a sense of hopelessness and occasional thoughts of being okay with not waking up. However, she also reflected on the importance of her family, especially her children, which provides her with a sense of purpose.   The behavioral health counselor assessed her risk as low but reviewed emergency resources with her in case symptoms escalate, and she was receptive to this support. To monitor progress and provide support, we will meet weekly, with the next follow-up scheduled for next week.  PHQ-9 Scores:     08/04/2023    2:11 PM 05/12/2023    2:48 PM 04/21/2023    9:48 AM  04/09/2023   10:23 AM 05/02/2021    9:37 AM  Depression screen PHQ 2/9  Decreased Interest 2 2 2 2 2   Down, Depressed, Hopeless 1 1 1 2 1   PHQ - 2 Score 3 3 3 4 3   Altered sleeping 1 2 1 1 2   Tired, decreased energy 1 2 2 1 1   Change in appetite 0 2 2 2 1   Feeling bad or failure about yourself  2 1 2 2 1   Trouble concentrating 2 2 1 3  0  Moving slowly or fidgety/restless 1 0 1 2 1   Suicidal thoughts 0 1 1 0 0  PHQ-9 Score 10 13 13 15 9   Difficult doing work/chores Somewhat difficult   Somewhat difficult Somewhat difficult   GAD-7 Scores:     08/04/2023    2:23 PM 05/12/2023    3:14 PM 04/21/2023    9:48 AM 04/09/2023   10:23 AM  GAD 7 : Generalized Anxiety Score  Nervous, Anxious, on Edge 3 2 2 2   Control/stop worrying 3 2 2 2   Worry too much - different things 3 2 2 2   Trouble relaxing 1 1 2 2   Restless 1 1 2 1   Easily annoyed or irritable 2 2 2 3   Afraid - awful might happen 0 1 2 1   Total GAD 7 Score 13 11 14 13   Anxiety Difficulty Very difficult Somewhat difficult  Somewhat difficult    Stress Current stressors:  work, kids Sleep:  disrupted Appetite:  good Coping ability:  fair Patient taking medications as prescribed:  yes  Current medications:  Outpatient Encounter Medications as  of 08/04/2023  Medication Sig   citalopram (CELEXA) 10 MG tablet Take 1 tablet (10 mg total) by mouth daily.   desogestrel-ethinyl estradiol (MIRCETTE) 0.15-0.02/0.01 MG (21/5) tablet Take 1 tablet by mouth daily.   ibuprofen (ADVIL) 800 MG tablet Take 1 tablet (800 mg total) by mouth every 8 (eight) hours as needed.   ondansetron (ZOFRAN) 4 MG tablet Take 1 tablet (4 mg total) by mouth every 8 (eight) hours as needed for nausea or vomiting.   SUMAtriptan (IMITREX) 50 MG tablet Take 1 tablet when headache occurs. May repeat in 2 hours if headache persists or recurs.   Vitamin D, Ergocalciferol, (DRISDOL) 1.25 MG (50000 UNIT) CAPS capsule Take 1 capsule (50,000 Units total) by mouth every 7  (seven) days.   No facility-administered encounter medications on file as of 08/04/2023.     Self-harm Behaviors Risk Assessment Self-harm risk factors:  Past thoughts, recent thoughts Patient endorses recent thoughts of harming self:  Yes Flowsheet Row Integrated Behavioral Health from 08/04/2023 in [facility] ED from 06/10/2023 in [facility] Integrated [facility]al [facility] from 05/12/2023 in [facility]  C-SSRS RISK CATEGORY Low Risk No Risk Low Risk       Danger to Others Risk Assessment Danger to others risk factors:  None Patient endorses recent thoughts of harming others:  Denies   Substance Use Assessment Patient recently consumed alcohol:  No  Alcohol Use Disorder Identification Test (AUDIT):     04/21/2023    9:53 AM  Alcohol Use Disorder Test (AUDIT)  1. How often do you have a drink containing alcohol? 1  2. How many drinks containing alcohol do you have on a typical day when you are drinking? 0  3. How often do you have six or more drinks on one occasion? 0  AUDIT-C Score 1    Goals, Interventions and Follow-up Plan Goals: Medication adherence, increase activity and decrease depression Interventions: Mindfulness or Relaxation Training, Behavioral Activation, and CBT Cognitive Behavioral Therapy Follow-up Plan:  weekly cbt    Reuel Boom

## 2023-08-11 ENCOUNTER — Encounter: Payer: Medicaid Other | Admitting: Professional Counselor

## 2023-08-18 ENCOUNTER — Encounter: Payer: Medicaid Other | Admitting: Professional Counselor

## 2023-10-04 ENCOUNTER — Other Ambulatory Visit: Payer: Self-pay

## 2023-10-04 ENCOUNTER — Emergency Department (HOSPITAL_COMMUNITY)
Admission: EM | Admit: 2023-10-04 | Discharge: 2023-10-04 | Disposition: A | Discharge status: Home / Self Care | Payer: Medicaid Other | Attending: Emergency Medicine | Admitting: Emergency Medicine

## 2023-10-04 DIAGNOSIS — H1031 Unspecified acute conjunctivitis, right eye: Secondary | ICD-10-CM | POA: Insufficient documentation

## 2023-10-04 DIAGNOSIS — H5789 Other specified disorders of eye and adnexa: Secondary | ICD-10-CM | POA: Diagnosis present

## 2023-10-04 MED ORDER — TETRACAINE HCL 0.5 % OP SOLN
2.0000 [drp] | Freq: Once | OPHTHALMIC | Status: AC
  Administered 2023-10-04: 2 [drp] via OPHTHALMIC
  Filled 2023-10-04: qty 4

## 2023-10-04 MED ORDER — FLUORESCEIN SODIUM 1 MG OP STRP
1.0000 | ORAL_STRIP | Freq: Once | OPHTHALMIC | Status: AC
  Administered 2023-10-04: 1 via OPHTHALMIC
  Filled 2023-10-04: qty 1

## 2023-10-04 MED ORDER — ERYTHROMYCIN 5 MG/GM OP OINT: TOPICAL_OINTMENT | Freq: Four times a day (QID) | OPHTHALMIC | 0 refills | Status: AC

## 2023-10-04 NOTE — ED Provider Notes (Signed)
 [place] EMERGENCY DEPARTMENT AT [facility] Provider Note   CSN: [id number] Arrival date & time: 10/04/23  9081     History  Chief Complaint  Patient presents with   Eye Problem    Chelsea Strong is a 33 y.o. female who presents with right-sided eye irritation and clear drainage since yesterday.  Patient denies any injury or trauma to the eye.  Notes that she woke up her symptoms.  Does describe some associated congestion.  States her eye feels  gritty, as if there is something in her eye.  No pain or difficulty with eye movement.  Feels like her vision is impaired from drainage.  Does not wear contacts.   Eye Problem Associated symptoms: discharge and redness        Home Medications Prior to Admission medications   Medication Sig Start Date End Date Taking? Authorizing Provider  erythromycin  ophthalmic ointment Place into the right eye 4 (four) times daily. Place a 1/2 inch ribbon of ointment into the lower eyelid for seven days 10/04/23  Yes Donnajean Lynwood DEL, PA-C  citalopram  (CELEXA ) 10 MG tablet Take 1 tablet (10 mg total) by mouth daily. 04/09/23   Joesph Annabella HERO, FNP  desogestrel -ethinyl estradiol  (MIRCETTE ) 0.15-0.02/0.01 MG (21/5) tablet Take 1 tablet by mouth daily. 04/21/23   Jayne Vonn DEL, MD  ibuprofen  (ADVIL ) 800 MG tablet Take 1 tablet (800 mg total) by mouth every 8 (eight) hours as needed. 04/21/23   Jayne Vonn DEL, MD  ondansetron  (ZOFRAN ) 4 MG tablet Take 1 tablet (4 mg total) by mouth every 8 (eight) hours as needed for nausea or vomiting. 04/09/23   Joesph Annabella HERO, FNP  SUMAtriptan  (IMITREX ) 50 MG tablet Take 1 tablet when headache occurs. May repeat in 2 hours if headache persists or recurs. 04/09/23   Joesph Annabella HERO, FNP  Vitamin D , Ergocalciferol , (DRISDOL ) 1.25 MG (50000 UNIT) CAPS capsule Take 1 capsule (50,000 Units total) by mouth every 7 (seven) days. 04/10/23   Joesph Annabella HERO, FNP      Allergies    Patient has no known  allergies.    Review of Systems   Review of Systems  Eyes:  Positive for discharge and redness.    Physical Exam Updated Vital Signs BP 120/75   Pulse (!) 59   Temp 98.2 F (36.8 C) (Oral)   Resp 17   Ht 5' 1 (1.549 m)   Wt 72.6 kg   SpO2 100%   BMI 30.23 kg/m  Physical Exam Vitals and nursing note reviewed.  Constitutional:      General: She is not in acute distress.    Appearance: She is well-developed.  HENT:     Head: Normocephalic and atraumatic.  Eyes:     Extraocular Movements:     Right eye: Normal extraocular motion.     Left eye: Abnormal extraocular motion present.     Conjunctiva/sclera:     Right eye: Right conjunctiva is injected.     Comments: Right eye with clear discharge  Pulmonary:     Effort: Pulmonary effort is normal. No respiratory distress.  Musculoskeletal:        General: No swelling.     Cervical back: Neck supple.  Skin:    General: Skin is warm and dry.     Capillary Refill: Capillary refill takes less than 2 seconds.  Neurological:     Mental Status: She is alert.  Psychiatric:        Mood and  Affect: Mood normal.     ED Results / Procedures / Treatments   Labs (all labs ordered are listed, but only abnormal results are displayed) Labs Reviewed - No data to display  EKG None  Radiology No results found.  Procedures Procedures    Medications Ordered in ED Medications  tetracaine  (PONTOCAINE) 0.5 % ophthalmic solution 2 drop (2 drops Right Eye Given 10/04/23 1026)  fluorescein  ophthalmic strip 1 strip (1 strip Right Eye Given 10/04/23 1026)    ED Course/ Medical Decision Making/ A&P                                 Medical Decision Making Risk Prescription drug management.   This patient presents to the ED with chief complaint(s) of right eye irritation and discharge.  The complaint involves an extensive differential diagnosis and also carries with it a high risk of complications and morbidity.   pertinent past  medical history as listed in HPI  The differential diagnosis includes  Conjunctivitis, corneal abrasion, uveitis, glaucoma, cellulitis The initial plan is to  Will obtain visual acuity and fluorescein  Additional history obtained:  Records reviewed previous admission documents  Initial Assessment:   Nontoxic, afebrile, hemodynamically stable patient presenting with right eye irritation and clear drainage.  Does report some associated congestion.  There is no eye pain or difficulty with EOMs, no significant periorbital swelling, overall low suspicion for cellulitis, glaucoma or uveitis.  Overall most suspicious for conjunctivitis versus corneal abrasion.  Perform fluorescein  stain, and if negative treat as bacterial conjunctivitis.  Independent ECG interpretation:  none  Independent labs interpretation:  The following labs were independently interpreted:  none  Independent visualization and interpretation of imaging: none  Treatment and Reassessment: Visual acuity: Right 20/ 25, left 20/10 Fluorescein  stain performed without any uptake.  Discussed with patient that her exam is most consistent with conjunctivitis, an antibiotic will be sent into her pharmacy.  Discussed discharge plan with patient she is agreeable.    Consultations obtained:   none  Disposition:   Patient discharged home.  Given weeklong course of erythromycin  drops. The patient has been appropriately medically screened and/or stabilized in the ED. I have low suspicion for any other emergent medical condition which would require further screening, evaluation or treatment in the ED or require inpatient management. At time of discharge the patient is hemodynamically stable and in no acute distress. I have discussed work-up results and diagnosis with patient and answered all questions. Patient is agreeable with discharge plan. We discussed strict return precautions for returning to the emergency department and they  verbalized understanding.     Social Determinants of Health:   none  This note was dictated with voice recognition software.  Despite best efforts at proofreading, errors may have occurred which can change the documentation meaning.          Final Clinical Impression(s) / ED Diagnoses Final diagnoses:  Acute conjunctivitis of right eye, unspecified acute conjunctivitis type    Rx / DC Orders ED Discharge Orders          Ordered    erythromycin  ophthalmic ointment  4 times daily        10/04/23 1055              Donnajean Lynwood VEAR DEVONNA 10/04/23 1056    Suzette Pac, MD 10/06/23 1456

## 2023-10-04 NOTE — ED Triage Notes (Signed)
 Pt c/o right eye painful drainage and red. Pt states it feels like something is gritty in her eye. Pt is not aware of any trauma to eye.

## 2023-10-04 NOTE — Discharge Instructions (Addendum)
 It was a pleasure taking care of you today.  You were evaluated in the emergency room for right eye irritation and drainage.  A fluorescein  stain was performed which showed no foreign body or corneal abrasion.  As discussed your exam is most consistent with conjunctivitis.  An antibiotic was sent into your pharmacy.  Please use these eyedrops as instructed and complete the full course.  Please return to the emergency room if you experience sudden worsening of eye pain, pain with eye movement, significant swelling around your eye.

## 2023-12-25 ENCOUNTER — Ambulatory Visit: Admitting: Family Medicine

## 2023-12-26 ENCOUNTER — Encounter: Payer: Self-pay | Admitting: Family Medicine

## 2024-03-10 ENCOUNTER — Ambulatory Visit: Admitting: *Deleted

## 2024-03-10 ENCOUNTER — Other Ambulatory Visit (HOSPITAL_COMMUNITY)
Admission: RE | Admit: 2024-03-10 | Discharge: 2024-03-10 | Disposition: A | Discharge status: Home / Self Care | Source: Ambulatory Visit | Attending: Obstetrics & Gynecology | Admitting: Obstetrics & Gynecology

## 2024-03-10 DIAGNOSIS — Z113 Encounter for screening for infections with a predominantly sexual mode of transmission: Secondary | ICD-10-CM

## 2024-03-10 NOTE — Progress Notes (Signed)
   NURSE VISIT- VAGINITIS/STD/POC  SUBJECTIVE:  Chelsea Strong is a 33 y.o. [id number] GYN patientfemale here for a vaginal swab for STD screen.  She reports the following symptoms: none for 0 days. Denies abnormal vaginal bleeding, significant pelvic pain, fever, or UTI symptoms.  OBJECTIVE:  There were no vitals taken for this visit.  Appears well, in no apparent distress  ASSESSMENT: Vaginal swab for STD screen  PLAN: Self-collected vaginal probe for Gonorrhea, Chlamydia, Trichomonas, Bacterial Vaginosis, Yeast sent to lab Treatment: to be determined once results are received Follow-up as needed if symptoms persist/worsen, or new symptoms develop  Kerrie Peek  03/10/2024 11:31 AM

## 2024-03-11 LAB — CERVICOVAGINAL ANCILLARY ONLY
Bacterial Vaginitis (gardnerella): POSITIVE — AB
Candida Glabrata: NEGATIVE
Candida Vaginitis: NEGATIVE
Chlamydia: NEGATIVE
Comment: NEGATIVE
Comment: NEGATIVE
Comment: NEGATIVE
Comment: NEGATIVE
Comment: NEGATIVE
Comment: NORMAL
Neisseria Gonorrhea: NEGATIVE
Trichomonas: POSITIVE — AB

## 2024-03-12 ENCOUNTER — Ambulatory Visit: Admitting: Family Medicine

## 2024-03-15 ENCOUNTER — Ambulatory Visit: Payer: Self-pay | Admitting: Women's Health

## 2024-03-15 DIAGNOSIS — A599 Trichomoniasis, unspecified: Secondary | ICD-10-CM | POA: Insufficient documentation

## 2024-03-15 MED ORDER — METRONIDAZOLE 500 MG PO TABS: 500.0000 mg | ORAL_TABLET | Freq: Two times a day (BID) | ORAL | 0 refills | Status: AC

## 2024-03-16 ENCOUNTER — Encounter: Payer: Self-pay | Admitting: Family Medicine

## 2024-03-17 ENCOUNTER — Encounter: Payer: Self-pay | Admitting: Family Medicine

## 2024-03-17 ENCOUNTER — Ambulatory Visit: Admitting: Family Medicine
